# Patient Record
Sex: Female | Born: 1945 | Race: White | Hispanic: No | Marital: Married | Smoking: Never smoker
Health system: Southern US, Community
[De-identification: ages and names within clinical notes are randomized; demographics above are authoritative.]

## PROBLEM LIST (undated history)

## (undated) DIAGNOSIS — Z8619 Personal history of other infectious and parasitic diseases: Secondary | ICD-10-CM

## (undated) DIAGNOSIS — S92912A Unspecified fracture of left toe(s), initial encounter for closed fracture: Secondary | ICD-10-CM

## (undated) DIAGNOSIS — M26629 Arthralgia of temporomandibular joint, unspecified side: Secondary | ICD-10-CM

## (undated) DIAGNOSIS — K5790 Diverticulosis of intestine, part unspecified, without perforation or abscess without bleeding: Secondary | ICD-10-CM

## (undated) DIAGNOSIS — M1990 Unspecified osteoarthritis, unspecified site: Secondary | ICD-10-CM

## (undated) DIAGNOSIS — M539 Dorsopathy, unspecified: Secondary | ICD-10-CM

## (undated) DIAGNOSIS — B0229 Other postherpetic nervous system involvement: Secondary | ICD-10-CM

## (undated) DIAGNOSIS — E785 Hyperlipidemia, unspecified: Secondary | ICD-10-CM

## (undated) DIAGNOSIS — N952 Postmenopausal atrophic vaginitis: Secondary | ICD-10-CM

## (undated) DIAGNOSIS — E559 Vitamin D deficiency, unspecified: Secondary | ICD-10-CM

## (undated) DIAGNOSIS — G8929 Other chronic pain: Secondary | ICD-10-CM

## (undated) DIAGNOSIS — M8580 Other specified disorders of bone density and structure, unspecified site: Secondary | ICD-10-CM

## (undated) DIAGNOSIS — R6884 Jaw pain: Secondary | ICD-10-CM

## (undated) DIAGNOSIS — E871 Hypo-osmolality and hyponatremia: Secondary | ICD-10-CM

## (undated) HISTORY — DX: Jaw pain: R68.84

## (undated) HISTORY — DX: Hypo-osmolality and hyponatremia: E87.1

## (undated) HISTORY — DX: Personal history of other infectious and parasitic diseases: Z86.19

## (undated) HISTORY — PX: JOINT REPLACEMENT: SHX530

## (undated) HISTORY — DX: Other chronic pain: G89.29

## (undated) HISTORY — DX: Dorsopathy, unspecified: M53.9

## (undated) HISTORY — DX: Other specified disorders of bone density and structure, unspecified site: M85.80

## (undated) HISTORY — DX: Arthralgia of temporomandibular joint, unspecified side: M26.629

## (undated) HISTORY — PX: APPENDECTOMY: SHX54

## (undated) HISTORY — PX: ADENOIDECTOMY: SHX5191

## (undated) HISTORY — PX: KNEE ARTHROSCOPY: SUR90

---

## 1969-04-12 HISTORY — PX: WISDOM TOOTH EXTRACTION: SHX21

## 1997-06-24 ENCOUNTER — Ambulatory Visit (HOSPITAL_COMMUNITY): Admission: RE | Admit: 1997-06-24 | Discharge: 1997-06-24 | Payer: Self-pay | Admitting: *Deleted

## 1997-09-16 ENCOUNTER — Other Ambulatory Visit: Admission: RE | Admit: 1997-09-16 | Discharge: 1997-09-16 | Payer: Self-pay | Admitting: Obstetrics and Gynecology

## 1998-07-09 ENCOUNTER — Ambulatory Visit (HOSPITAL_COMMUNITY): Admission: RE | Admit: 1998-07-09 | Discharge: 1998-07-09 | Payer: Self-pay | Admitting: *Deleted

## 1998-10-10 ENCOUNTER — Other Ambulatory Visit: Admission: RE | Admit: 1998-10-10 | Discharge: 1998-10-10 | Payer: Self-pay | Admitting: *Deleted

## 1999-11-03 ENCOUNTER — Other Ambulatory Visit: Admission: RE | Admit: 1999-11-03 | Discharge: 1999-11-03 | Payer: Self-pay | Admitting: Obstetrics and Gynecology

## 2000-11-29 ENCOUNTER — Other Ambulatory Visit: Admission: RE | Admit: 2000-11-29 | Discharge: 2000-11-29 | Payer: Self-pay | Admitting: Obstetrics and Gynecology

## 2002-01-09 ENCOUNTER — Other Ambulatory Visit: Admission: RE | Admit: 2002-01-09 | Discharge: 2002-01-09 | Payer: Self-pay | Admitting: Obstetrics and Gynecology

## 2003-01-15 ENCOUNTER — Other Ambulatory Visit: Admission: RE | Admit: 2003-01-15 | Discharge: 2003-01-15 | Payer: Self-pay | Admitting: Obstetrics and Gynecology

## 2003-11-20 HISTORY — PX: COLONOSCOPY: SHX174

## 2004-12-11 DIAGNOSIS — Z8619 Personal history of other infectious and parasitic diseases: Secondary | ICD-10-CM

## 2004-12-11 HISTORY — DX: Personal history of other infectious and parasitic diseases: Z86.19

## 2006-04-19 ENCOUNTER — Encounter: Admission: RE | Admit: 2006-04-19 | Discharge: 2006-04-19 | Payer: Self-pay | Admitting: Internal Medicine

## 2006-10-18 ENCOUNTER — Encounter: Admission: RE | Admit: 2006-10-18 | Discharge: 2006-10-18 | Payer: Self-pay | Admitting: Internal Medicine

## 2010-05-02 ENCOUNTER — Encounter: Payer: Self-pay | Admitting: Internal Medicine

## 2010-05-03 ENCOUNTER — Encounter: Payer: Self-pay | Admitting: Internal Medicine

## 2011-07-12 DIAGNOSIS — S92912A Unspecified fracture of left toe(s), initial encounter for closed fracture: Secondary | ICD-10-CM

## 2011-07-12 HISTORY — DX: Unspecified fracture of left toe(s), initial encounter for closed fracture: S92.912A

## 2011-07-25 ENCOUNTER — Ambulatory Visit: Payer: MEDICARE

## 2011-07-25 ENCOUNTER — Ambulatory Visit (INDEPENDENT_AMBULATORY_CARE_PROVIDER_SITE_OTHER): Payer: MEDICARE | Admitting: Family Medicine

## 2011-07-25 VITALS — BP 102/68 | HR 70 | Temp 98.7°F | Resp 16 | Ht 65.0 in | Wt 129.0 lb

## 2011-07-25 DIAGNOSIS — M79609 Pain in unspecified limb: Secondary | ICD-10-CM

## 2011-07-25 DIAGNOSIS — M79675 Pain in left toe(s): Secondary | ICD-10-CM

## 2011-07-25 NOTE — Progress Notes (Signed)
  Subjective:    Patient ID: Courtney Shaffer, female    DOB: June 05, 1945, 66 y.o.   MRN: [id number]  HPI Patient presents complaining of (L) 5th toe pain. Injury in January.  Toe deviated laterally.  Patient self reduced toe. Since toe feels "thick and swollen".  Skin burns or throbs on the lateral aspect of digit.   Review of Systems     Objective:   Physical Exam  Constitutional: She appears well-developed.  Cardiovascular: Normal rate, regular rhythm and normal heart sounds.   Pulmonary/Chest: Effort normal.  Musculoskeletal:       Feet:  Neurological: She is alert.    [facility] reading (PRIMARY) by  Dr. 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

## 2011-08-25 ENCOUNTER — Encounter: Payer: Self-pay | Admitting: Internal Medicine

## 2011-08-25 ENCOUNTER — Ambulatory Visit (INDEPENDENT_AMBULATORY_CARE_PROVIDER_SITE_OTHER): Payer: MEDICARE | Admitting: Internal Medicine

## 2011-08-25 VITALS — BP 104/59 | HR 71 | Temp 98.1°F | Resp 16 | Ht 64.5 in | Wt 131.4 lb

## 2011-08-25 DIAGNOSIS — E559 Vitamin D deficiency, unspecified: Secondary | ICD-10-CM | POA: Insufficient documentation

## 2011-08-25 DIAGNOSIS — Z0000 Encounter for general adult medical examination without abnormal findings: Secondary | ICD-10-CM

## 2011-08-25 DIAGNOSIS — E785 Hyperlipidemia, unspecified: Secondary | ICD-10-CM | POA: Insufficient documentation

## 2011-08-25 LAB — LIPID PANEL
LDL Cholesterol: 87 mg/dL (ref 0–99)
Triglycerides: 53 mg/dL (ref <150)

## 2011-08-25 LAB — COMPREHENSIVE METABOLIC PANEL
ALT: 28 U/L (ref 0–35)
Albumin: 4.4 g/dL (ref 3.5–5.2)
CO2: 31 mEq/L (ref 19–32)
Calcium: 9.8 mg/dL (ref 8.4–10.5)
Chloride: 99 mEq/L (ref 96–112)
Glucose, Bld: 92 mg/dL (ref 70–99)
Sodium: 135 mEq/L (ref 135–145)
Total Protein: 6.3 g/dL (ref 6.0–8.3)

## 2011-08-25 LAB — CBC WITH DIFFERENTIAL/PLATELET
Basophils Absolute: 0 10*3/uL (ref 0.0–0.1)
Basophils Relative: 1 % (ref 0–1)
Eosinophils Relative: 4 % (ref 0–5)
HCT: 40.1 % (ref 36.0–46.0)
Lymphocytes Relative: 29 % (ref 12–46)
MCHC: 34.2 g/dL (ref 30.0–36.0)
Monocytes Absolute: 0.3 10*3/uL (ref 0.1–1.0)
Neutro Abs: 2.5 10*3/uL (ref 1.7–7.7)
Platelets: 321 10*3/uL (ref 150–400)
RDW: 13.2 % (ref 11.5–15.5)
WBC: 4.2 10*3/uL (ref 4.0–10.5)

## 2011-08-25 NOTE — Progress Notes (Signed)
  Subjective:    Patient ID: Courtney Shaffer, female    DOB: 20-Nov-1945, 66 y.o.   MRN: [id number]  HPIwelcome to Medicare physical checkup She has no current complaints and is recovering from a recent toe fracture Only medication simvastatin although also on vitamin D supplementation because of past vitamin D deficiency. 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 She has mild left knee osteoarthritis. 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 First child at 25 with postpartum depression one or 2kids//Courtney Shaffer = 2 kids and doing well  Past history is healthy OB/GYN doctor [place]  Family history significant for lots of depression/parents had strokes in their 70s No nicotine or alcohol No home safety issues  Review of Systems-All points covered on review of systems survey and all answers are negative Beck depression scale with no points      Objective:   Physical Examno hearing loss Vital signs within normal limits including the bmi HEENT clear including no thyromegaly Heart regular without murmurs Lungs clear Abdomen benign Extremities with full range of motion, full peripheral pulses, and no joint problems except mild swelling of the left fifth toe from recent fracture./no risk of falling based on musculoskeletal dynamics Skin clear Neurological intact    Assessment & Plan:  Welcome to Medicare physical-she is very healthy for her age  Zostavax ordered Tdap 2008 Lipid profile/try to assess possible dose change with simvastatin Check vitamin D/glucose Colonoscopy up-to-date 2005 Mammogram up-to-date Bone density scheduled for Courtney Shaffer Pap up-to-date with breast exam and fecal occult blood testing Prior EKG does not need repeating Consider Pneumovax Followup one year if stable

## 2011-08-26 LAB — VITAMIN D 25 HYDROXY (VIT D DEFICIENCY, FRACTURES): Vit D, 25-Hydroxy: 58 ng/mL (ref 30–89)

## 2011-08-30 ENCOUNTER — Encounter: Payer: Self-pay | Admitting: Internal Medicine

## 2012-05-04 ENCOUNTER — Other Ambulatory Visit: Payer: Self-pay | Admitting: *Deleted

## 2012-05-04 MED ORDER — SIMVASTATIN 20 MG PO TABS: 20.0000 mg | ORAL_TABLET | Freq: Every evening | ORAL | Status: DC

## 2012-07-06 ENCOUNTER — Encounter: Payer: Self-pay | Admitting: Internal Medicine

## 2012-07-13 ENCOUNTER — Encounter: Payer: Self-pay | Admitting: Internal Medicine

## 2012-12-06 DIAGNOSIS — E785 Hyperlipidemia, unspecified: Secondary | ICD-10-CM | POA: Insufficient documentation

## 2012-12-06 DIAGNOSIS — B0229 Other postherpetic nervous system involvement: Secondary | ICD-10-CM | POA: Insufficient documentation

## 2012-12-06 DIAGNOSIS — N952 Postmenopausal atrophic vaginitis: Secondary | ICD-10-CM | POA: Insufficient documentation

## 2012-12-20 ENCOUNTER — Ambulatory Visit (INDEPENDENT_AMBULATORY_CARE_PROVIDER_SITE_OTHER): Payer: Medicare Other | Admitting: Internal Medicine

## 2012-12-20 ENCOUNTER — Encounter: Payer: Self-pay | Admitting: Internal Medicine

## 2012-12-20 VITALS — BP 110/62 | HR 64 | Temp 98.0°F | Resp 16 | Ht 64.5 in | Wt 130.4 lb

## 2012-12-20 DIAGNOSIS — E785 Hyperlipidemia, unspecified: Secondary | ICD-10-CM

## 2012-12-20 DIAGNOSIS — Z0000 Encounter for general adult medical examination without abnormal findings: Secondary | ICD-10-CM

## 2012-12-20 LAB — COMPREHENSIVE METABOLIC PANEL
ALT: 26 U/L (ref 0–35)
AST: 29 U/L (ref 0–37)
Albumin: 4.3 g/dL (ref 3.5–5.2)
Alkaline Phosphatase: 41 U/L (ref 39–117)
BUN: 7 mg/dL (ref 6–23)
Potassium: 4.1 mEq/L (ref 3.5–5.3)
Sodium: 135 mEq/L (ref 135–145)
Total Protein: 6.6 g/dL (ref 6.0–8.3)

## 2012-12-20 LAB — LIPID PANEL
Cholesterol: 204 mg/dL — ABNORMAL HIGH (ref 0–200)
HDL: 84 mg/dL (ref >39)
Total CHOL/HDL Ratio: 2.4 Ratio

## 2012-12-20 LAB — CBC
MCHC: 34.6 g/dL (ref 30.0–36.0)
Platelets: 330 10*3/uL (ref 150–400)
RDW: 13 % (ref 11.5–15.5)
WBC: 3.3 10*3/uL — ABNORMAL LOW (ref 4.0–10.5)

## 2012-12-20 NOTE — Progress Notes (Signed)
  Subjective:    Patient ID: Courtney Shaffer, female    DOB: 1945/10/13, 67 y.o.   MRN: [id number]  HPI Has had a good year. Granddaughter in [place] doing better. Goes to [place] to see daughter and grandchildren. Family all doing well. Teaching 2 private yoga classes.  Husband recently retired. Will be working at [employer].   Has family history of CAD. Comfortable taking Zocor for peace of mind.   Sees derm for screening.  Still has scalp numbness, feels different. Not annoying.  Review of Systems  Constitutional: Negative.   HENT: Negative.        Still a numbness L Occiput but not interf w/ activ  Eyes: Negative.   Respiratory: Negative.   Cardiovascular: Negative.   Gastrointestinal: Negative.   Endocrine: Negative.   Genitourinary: Negative.   Allergic/Immunologic: Negative.   Neurological: Negative.   Hematological: Negative.   Psychiatric/Behavioral: Negative.    Has a shingles vaccine prescription from Dr. Hal Hope.    Objective:   Physical Exam  Constitutional: She is oriented to person, place, and time. She appears well-developed and well-nourished.  HENT:  Head: Normocephalic.  Right Ear: External ear normal.  Left Ear: External ear normal.  Nose: Nose normal.  Mouth/Throat: Oropharynx is clear and moist.  Eyes: Conjunctivae and EOM are normal. Pupils are equal, round, and reactive to light.  Neck: Normal range of motion. Neck supple. No tracheal deviation present. No thyromegaly present.  Cardiovascular: Normal rate, regular rhythm, normal heart sounds and intact distal pulses.   No murmur heard. Pulmonary/Chest: Effort normal and breath sounds normal.  Abdominal: Soft. Bowel sounds are normal. She exhibits no distension and no mass. There is no tenderness. There is no rebound and no guarding.  Musculoskeletal: Normal range of motion. She exhibits no edema and no tenderness.  Lymphadenopathy:    She has no cervical adenopathy.  Neurological: She is alert and  oriented to person, place, and time. She has normal reflexes. No cranial nerve deficit.  Skin: Skin is warm and dry.  Psychiatric: She has a normal mood and affect. Her behavior is normal. Judgment and thought content normal.          Assessment & Plan:  Annual physical exam - Plan: CBC, Comprehensive metabolic panel, Lipid panel  Other and unspecified hyperlipidemia  Meds ordered this encounter  Medications  . simvastatin (ZOCOR) 20 MG tablet    Sig: Take 1 tablet (20 mg total) by mouth every evening.    Dispense:  90 tablet    Refill:  3

## 2012-12-22 ENCOUNTER — Encounter: Payer: Self-pay | Admitting: Internal Medicine

## 2012-12-22 MED ORDER — SIMVASTATIN 20 MG PO TABS
20.0000 mg | ORAL_TABLET | Freq: Every evening | ORAL | Status: DC
Start: 2012-12-22 — End: 2013-06-19

## 2013-06-18 ENCOUNTER — Other Ambulatory Visit: Payer: Self-pay

## 2013-06-18 DIAGNOSIS — E785 Hyperlipidemia, unspecified: Secondary | ICD-10-CM

## 2013-06-18 NOTE — Telephone Encounter (Signed)
Can you resend a written Rx? She is asking for a written Rx to mail to pharmacy, why was this denied?

## 2013-06-18 NOTE — Telephone Encounter (Signed)
Patient came into 104 to drop off a letter requesting medication (mail in order form for Courtney Shaffer to complete) for simastation. Tab 20mg . I have added her new insurance aarp medicare complete in the system (in case clinical needs it for prior auth or not) please mail back to her when complete so she can put her payment into it also which is required . Thank you!  For questions call: [phone].

## 2013-06-18 NOTE — Telephone Encounter (Signed)
Pended, please advise. Patient wants printed Rx, please let me know after this prints so I can mail to patient with her form

## 2013-06-19 MED ORDER — SIMVASTATIN 20 MG PO TABS: 20.0000 mg | ORAL_TABLET | Freq: Every evening | ORAL | Status: DC

## 2013-06-19 NOTE — Addendum Note (Signed)
Addended by: Elwyn Reach A on: 06/19/2013 05:12 PM   Modules accepted: Orders

## 2013-06-19 NOTE — Telephone Encounter (Signed)
Rx printed and signed. Mailed back to pt w/form in envelope provided. Called and [facility] for pt to let her know it is being mailed out to her.

## 2013-08-22 ENCOUNTER — Ambulatory Visit (INDEPENDENT_AMBULATORY_CARE_PROVIDER_SITE_OTHER): Payer: Medicare Other | Admitting: Internal Medicine

## 2013-08-22 ENCOUNTER — Encounter: Payer: Self-pay | Admitting: Internal Medicine

## 2013-08-22 VITALS — BP 112/65 | HR 68 | Temp 97.6°F | Resp 16 | Ht 64.5 in | Wt 127.0 lb

## 2013-08-22 DIAGNOSIS — E785 Hyperlipidemia, unspecified: Secondary | ICD-10-CM

## 2013-08-22 DIAGNOSIS — M8580 Other specified disorders of bone density and structure, unspecified site: Secondary | ICD-10-CM

## 2013-08-22 DIAGNOSIS — E782 Mixed hyperlipidemia: Secondary | ICD-10-CM | POA: Insufficient documentation

## 2013-08-22 DIAGNOSIS — Z0000 Encounter for general adult medical examination without abnormal findings: Secondary | ICD-10-CM

## 2013-08-22 HISTORY — DX: Other specified disorders of bone density and structure, unspecified site: M85.80

## 2013-08-22 LAB — POCT URINALYSIS DIPSTICK
Bilirubin, UA: NEGATIVE
Glucose, UA: NEGATIVE
Ketones, UA: NEGATIVE
Nitrite, UA: NEGATIVE
PROTEIN UA: NEGATIVE
Spec Grav, UA: 1.015
UROBILINOGEN UA: 0.2
pH, UA: 7.5

## 2013-08-22 LAB — CBC
HCT: 39.5 % (ref 36.0–46.0)
HEMOGLOBIN: 13.3 g/dL (ref 12.0–15.0)
MCH: 32 pg (ref 26.0–34.0)
MCHC: 33.7 g/dL (ref 30.0–36.0)
MCV: 95.2 fL (ref 78.0–100.0)
PLATELETS: 323 10*3/uL (ref 150–400)
RBC: 4.15 MIL/uL (ref 3.87–5.11)
RDW: 13.2 % (ref 11.5–15.5)
WBC: 3.7 10*3/uL — ABNORMAL LOW (ref 4.0–10.5)

## 2013-08-22 MED ORDER — SIMVASTATIN 20 MG PO TABS: 20.0000 mg | ORAL_TABLET | Freq: Every evening | ORAL | Status: DC

## 2013-08-22 NOTE — Progress Notes (Signed)
Subjective:  This chart was scribed for Tami Lin, MD by Mercy Moore, Medial Scribe. This patient was seen in room 27 and the patient's care was started at 10:49 AM.    Patient ID: Courtney Shaffer, female    DOB: [birth year], 68 y.o.   MRN: [id number]  HPI HPI Comments: Courtney Shaffer is a 68 y.o. female who presents to the [facility] requesting a complete physical exam. Patient says she feels great. Patient just came from an ophthalmologist appointment and reports that her eyes are fine with just a fine change in her vision. Patient shares plans to go camping very soon [employer] w/ Col[place] grandkids. Patient states that she is sleeping well. Patient states that she is still hesitant to receive the Shingles vaccine. States she does not receive seasonal vaccinations either. She is informed that vaccines are primarily to ensure that patients are not vectors for young and old. 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 Hyperlipemia 08/25/2011  . Vitamin d deficiency 08/25/2011   Past Medical History  Diagnosis Date  . Osteopenia    Past Surgical History  Procedure Laterality Date  . Appendectomy    . Knee arthroscopy     Allergies  Allergen Reactions  . Codeine    Prior to Admission medications   Medication Sig Start Date End Date Taking? Authorizing Provider  Multiple Vitamin (MULTIVITAMIN) tablet Take 1 tablet by mouth daily.    Historical Provider, MD  simvastatin (ZOCOR) 20 MG tablet Take 1 tablet (20 mg total) by mouth every evening. 06/19/13   Leandrew Koyanagi, MD     Review of Systems   All systems negative. See pink sheet     Objective:   Physical Exam  Nursing note and vitals reviewed. Constitutional: She is oriented to person, place, and time. She appears well-developed and well-nourished.  No distress.  HENT:  Head: Normocephalic and atraumatic.  Right Ear: External ear normal.  Left Ear: External ear normal.  Nose: Nose normal.  Mouth/Throat: Oropharynx is clear and moist.  Eyes: Conjunctivae and EOM are normal. Pupils are equal, round, and reactive to light.  Neck: Normal range of motion. Neck supple. No thyromegaly present.  Cardiovascular: Normal rate, regular rhythm and normal heart sounds.  Exam reveals no gallop and no friction rub.   No murmur heard. Pulmonary/Chest: Effort normal and breath sounds normal. No respiratory distress. She has no wheezes. She has no rales.  Abdominal: Soft. Bowel sounds are normal. She exhibits no distension and no mass. There is no tenderness. There is no rebound.  Musculoskeletal: Normal range of motion. She exhibits no edema and no tenderness.  Lymphadenopathy:    She has no cervical adenopathy.  Neurological: She is alert and oriented to person, place, and time. She has normal reflexes. No cranial nerve deficit.  Skin: Skin is warm and dry.  Psychiatric: She has a normal mood and affect. Her behavior is normal. Judgment and thought content normal.   BP 112/65  Pulse 68  Temp(Src) 97.6 F (36.4 C)  Resp 16  Ht 5' 4.5" (1.638 m)  Wt 127 lb (57.607 kg)  BMI 21.47 kg/m2  SpO2 100%        Assessment & Plan:  I have completed the patient encounter in its entirety as documented by the  scribe, with editing by me where necessary. [place] Shaffer P. Laney Pastor, M.D.  Annual physical exam - Plan: COMPLETE METABOLIC PANEL WITH GFR, CBC, POCT urinalysis dipstick  Other and unspecified hyperlipidemia - Plan: Lipid panel, simvastatin (ZOCOR) 20 MG tablet, DISCONTINUED: simvastatin (ZOCOR) 20 MG tablet  Osteopenia-R Hip  Meds ordered this encounter  Medications  . simvastatin (ZOCOR) 20 MG tablet    Sig: Take 1 tablet (20 mg total) by mouth every evening.    Dispense:  90 tablet    Refill:  3

## 2013-08-23 LAB — COMPLETE METABOLIC PANEL WITH GFR
ALT: 26 U/L (ref 0–35)
AST: 24 U/L (ref 0–37)
Albumin: 4.4 g/dL (ref 3.5–5.2)
Alkaline Phosphatase: 47 U/L (ref 39–117)
BUN: 9 mg/dL (ref 6–23)
CALCIUM: 9.4 mg/dL (ref 8.4–10.5)
CHLORIDE: 98 meq/L (ref 96–112)
CO2: 29 mEq/L (ref 19–32)
CREATININE: 0.62 mg/dL (ref 0.50–1.10)
GFR, Est African American: >89 mL/min
GFR, Est Non African American: >89 mL/min
Glucose, Bld: 96 mg/dL (ref 70–99)
Potassium: 4.2 mEq/L (ref 3.5–5.3)
Sodium: 134 mEq/L — ABNORMAL LOW (ref 135–145)
Total Bilirubin: 0.5 mg/dL (ref 0.2–1.2)
Total Protein: 6.2 g/dL (ref 6.0–8.3)

## 2013-08-23 LAB — LIPID PANEL
CHOL/HDL RATIO: 2.4 ratio
Cholesterol: 207 mg/dL — ABNORMAL HIGH (ref 0–200)
HDL: 85 mg/dL (ref >39)
LDL CALC: 109 mg/dL — AB (ref 0–99)
Triglycerides: 66 mg/dL (ref <150)
VLDL: 13 mg/dL (ref 0–40)

## 2013-08-28 ENCOUNTER — Encounter: Payer: Self-pay | Admitting: Internal Medicine

## 2013-09-19 ENCOUNTER — Encounter: Payer: Medicare Other | Admitting: Internal Medicine

## 2014-01-21 ENCOUNTER — Encounter: Payer: Self-pay | Admitting: Internal Medicine

## 2014-03-18 ENCOUNTER — Ambulatory Visit (AMBULATORY_SURGERY_CENTER): Payer: Self-pay | Admitting: *Deleted

## 2014-03-18 VITALS — Ht 65.0 in | Wt 134.0 lb

## 2014-03-18 DIAGNOSIS — Z1211 Encounter for screening for malignant neoplasm of colon: Secondary | ICD-10-CM

## 2014-03-18 MED ORDER — MOVIPREP 100 G PO SOLR: ORAL | Status: DC

## 2014-03-18 NOTE — Progress Notes (Signed)
Patient denies any allergies to eggs or soy. Patient denies any problems with anesthesia/sedation. Patient states she "woke up" during her knee surgery and felt pressure during colonoscopy. Patient denies any oxygen use at home and does not take any diet/weight loss medications. EMMI education assisgned to patient on colonoscopy, this was explained and instructions given to patient.

## 2014-04-01 ENCOUNTER — Encounter: Payer: Self-pay | Admitting: Internal Medicine

## 2014-04-01 ENCOUNTER — Ambulatory Visit (AMBULATORY_SURGERY_CENTER): Payer: Medicare Other | Admitting: Internal Medicine

## 2014-04-01 VITALS — BP 125/68 | HR 58 | Temp 97.6°F | Resp 36 | Ht 65.0 in | Wt 134.0 lb

## 2014-04-01 DIAGNOSIS — K5790 Diverticulosis of intestine, part unspecified, without perforation or abscess without bleeding: Secondary | ICD-10-CM

## 2014-04-01 DIAGNOSIS — Z1211 Encounter for screening for malignant neoplasm of colon: Secondary | ICD-10-CM

## 2014-04-01 HISTORY — PX: COLONOSCOPY: SHX174

## 2014-04-01 HISTORY — DX: Diverticulosis of intestine, part unspecified, without perforation or abscess without bleeding: K57.90

## 2014-04-01 MED ORDER — SODIUM CHLORIDE 0.9 % IV SOLN: 500.0000 mL | INTRAVENOUS | Status: DC

## 2014-04-01 NOTE — Op Note (Signed)
[facility]  [address] [place], [address]   COLONOSCOPY PROCEDURE REPORT  PATIENT: Courtney Shaffer, Courtney Shaffer  MR#: [id number] BIRTHDATE: 1945-10-26 , 87  yrs. old GENDER: female ENDOSCOPIST: Eustace Quail, MD REFERRED QT:[id number] Laney Pastor, M.D. PROCEDURE DATE:  04/01/2014 PROCEDURE:   Colonoscopy, screening First Screening Colonoscopy - Avg.  risk and is 50 yrs.  old or older - No.  Prior Negative Screening - Now for repeat screening. 10 or more years since last screening  History of Adenoma - Now for follow-up colonoscopy & has been > or = to 3 yrs.  N/A  Polyps Removed Today? No.  Recommend repeat exam, <10 yrs? No. ASA CLASS:   Class II INDICATIONS:average risk for colorectal cancer.. Negative index exam August 2005 [facility] gastroenterology) MEDICATIONS: Monitored anesthesia care and Propofol 230 mg IV  DESCRIPTION OF PROCEDURE:   After the risks benefits and alternatives of the procedure were thoroughly explained, informed consent was obtained.  The digital rectal exam revealed no abnormalities of the rectum.   The LB [id number]  endoscope was introduced through the anus and advanced to the cecum, which was identified by both the appendix and ileocecal valve. No adverse events experienced.   The quality of the prep was excellent, using MoviPrep  The instrument was then slowly withdrawn as the colon was fully examined.      COLON FINDINGS: There was moderate diverticulosis noted in the sigmoid colon and ascending colon.   The examination was otherwise normal.  Retroflexed views revealed no abnormalities. The time to cecum=5 minutes 16 seconds.  Withdrawal time=9 minutes 31 seconds. The scope was withdrawn and the procedure completed.   COMPLICATIONS: There were no immediate complications.  ENDOSCOPIC IMPRESSION: 1.   Moderate diverticulosis was noted in the sigmoid colon and ascending colon 2.   The examination was otherwise  normal  RECOMMENDATIONS: 1. Continue current colorectal screening recommendations for "routine risk" patients with a repeat colonoscopy in 10 years.  eSigned:  Eustace Quail, MD 04/01/2014 8:56 AM   cc: Tami Lin, MD and The Patient

## 2014-04-01 NOTE — Progress Notes (Signed)
Report to PACU, RN, vss, BBS= Clear.  

## 2014-04-01 NOTE — Patient Instructions (Signed)
Discharge instructions given. Handouts on diverticulosis and a high fiber diet. Resume previous medications. YOU HAD AN ENDOSCOPIC PROCEDURE TODAY AT THE [facility]: Refer to the procedure report that was given to you for any specific questions about what was found during the examination.  If the procedure report does not answer your questions, please call your gastroenterologist to clarify.  If you requested that your care partner not be given the details of your procedure findings, then the procedure report has been included in a sealed envelope for you to review at your convenience later.  YOU SHOULD EXPECT: Some feelings of bloating in the abdomen. Passage of more gas than usual.  Walking can help get rid of the air that was put into your GI tract during the procedure and reduce the bloating. If you had a lower endoscopy (such as a colonoscopy or flexible sigmoidoscopy) you may notice spotting of blood in your stool or on the toilet paper. If you underwent a bowel prep for your procedure, then you may not have a normal bowel movement for a few days.  DIET: Your first meal following the procedure should be a light meal and then it is ok to progress to your normal diet.  A half-sandwich or bowl of soup is an example of a good first meal.  Heavy or fried foods are harder to digest and may make you feel nauseous or bloated.  Likewise meals heavy in dairy and vegetables can cause extra gas to form and this can also increase the bloating.  Drink plenty of fluids but you should avoid alcoholic beverages for 24 hours.  ACTIVITY: Your care partner should take you home directly after the procedure.  You should plan to take it easy, moving slowly for the rest of the day.  You can resume normal activity the day after the procedure however you should NOT DRIVE or use heavy machinery for 24 hours (because of the sedation medicines used during the test).    SYMPTOMS TO REPORT IMMEDIATELY: A  gastroenterologist can be reached at any hour.  During normal business hours, 8:30 AM to 5:00 PM Monday through Friday, call [phone].  After hours and on weekends, please call the GI answering service at [phone] who will take a message and have the physician on call contact you.   Following lower endoscopy (colonoscopy or flexible sigmoidoscopy):  Excessive amounts of blood in the stool  Significant tenderness or worsening of abdominal pains  Swelling of the abdomen that is new, acute  Fever of 100F or higher  FOLLOW UP: If any biopsies were taken you will be contacted by phone or by letter within the next 1-3 weeks.  Call your gastroenterologist if you have not heard about the biopsies in 3 weeks.  Our staff will call the home number listed on your records the next business day following your procedure to check on you and address any questions or concerns that you may have at that time regarding the information given to you following your procedure. This is a courtesy call and so if there is no answer at the home number and we have not heard from you through the emergency physician on call, we will assume that you have returned to your regular daily activities without incident.  SIGNATURES/CONFIDENTIALITY: You and/or your care partner have signed paperwork which will be entered into your electronic medical record.  These signatures attest to the fact that that the information above on your After Visit Summary   has been reviewed and is understood.  Full responsibility of the confidentiality of this discharge information lies with you and/or your care-partner. 

## 2014-04-02 ENCOUNTER — Encounter: Payer: Self-pay | Admitting: Internal Medicine

## 2014-04-02 ENCOUNTER — Telehealth: Payer: Self-pay

## 2014-04-02 NOTE — Telephone Encounter (Signed)
Left message on answering machine. 

## 2014-04-08 ENCOUNTER — Telehealth: Payer: Self-pay | Admitting: Radiology

## 2014-04-08 DIAGNOSIS — E785 Hyperlipidemia, unspecified: Secondary | ICD-10-CM

## 2014-04-08 MED ORDER — SIMVASTATIN 20 MG PO TABS: 20.0000 mg | ORAL_TABLET | Freq: Every evening | ORAL | Status: DC

## 2014-04-08 NOTE — Telephone Encounter (Signed)
Patient walked in asking for renewal of the Simvastatin, this is sent in for her.

## 2014-07-01 ENCOUNTER — Encounter: Payer: Self-pay | Admitting: *Deleted

## 2014-07-25 ENCOUNTER — Telehealth: Payer: Self-pay

## 2014-07-25 DIAGNOSIS — E785 Hyperlipidemia, unspecified: Secondary | ICD-10-CM

## 2014-07-25 MED ORDER — SIMVASTATIN 20 MG PO TABS: 20.0000 mg | ORAL_TABLET | Freq: Every evening | ORAL | Status: DC

## 2014-07-25 NOTE — Telephone Encounter (Signed)
Patient request a refill on Simvastatin Tab 20MG . Pharmacy [facility] [phone]

## 2014-07-25 NOTE — Telephone Encounter (Signed)
Appt in June, Rx sent in.

## 2014-08-29 LAB — HM PAP SMEAR: HM Pap smear: NEGATIVE

## 2014-09-18 ENCOUNTER — Encounter: Payer: Self-pay | Admitting: Internal Medicine

## 2014-09-18 ENCOUNTER — Ambulatory Visit (INDEPENDENT_AMBULATORY_CARE_PROVIDER_SITE_OTHER): Payer: Medicare Other | Admitting: Internal Medicine

## 2014-09-18 VITALS — BP 90/52 | HR 75 | Temp 97.8°F | Resp 16 | Ht 64.5 in | Wt 133.4 lb

## 2014-09-18 DIAGNOSIS — E785 Hyperlipidemia, unspecified: Secondary | ICD-10-CM

## 2014-09-18 DIAGNOSIS — Z0000 Encounter for general adult medical examination without abnormal findings: Secondary | ICD-10-CM | POA: Diagnosis not present

## 2014-09-18 MED ORDER — SIMVASTATIN 20 MG PO TABS: 20.0000 mg | ORAL_TABLET | Freq: Every evening | ORAL | Status: DC

## 2014-09-18 NOTE — Progress Notes (Signed)
   Subjective:    Patient ID: Courtney Shaffer, female    DOB: [birth year], 69 y.o.   MRN: [id number]  HPI Annual F/u HL  Doing well Extremely active without complaints Active his grandmother Health maintenance issues up-to-date as far she is concerned--he declines flu shot Zostavax and pneumonia vaccine in keeping with her lifestyle Recent pneumonia negative. Recent colonoscopy negative No side effects from statins   Review of Systems  Constitutional: Negative.   HENT: Positive for nosebleeds and sore throat.        Feels like she developed a cold over the last 36 hours No fever or cough  Eyes: Negative.   Respiratory: Negative.   Cardiovascular: Negative.   Gastrointestinal: Negative.   Endocrine: Negative.   Genitourinary: Negative.   Musculoskeletal: Negative.   Skin: Negative.   Allergic/Immunologic: Negative.   Neurological: Negative.   Hematological: Negative.   Psychiatric/Behavioral: Negative.        Objective:   Physical Exam  Constitutional: She is oriented to person, place, and time. She appears well-developed and well-nourished. No distress.  HENT:  Head: Normocephalic.  Right Ear: External ear normal.  Left Ear: External ear normal.  Nose: Nose normal.  Mouth/Throat: Oropharynx is clear and moist.  Eyes: Conjunctivae and EOM are normal. Pupils are equal, round, and reactive to light.  Neck: Normal range of motion. Neck supple. No thyromegaly present.  Cardiovascular: Normal rate, regular rhythm, normal heart sounds and intact distal pulses.   No murmur heard. Pulmonary/Chest: Effort normal and breath sounds normal. She has no wheezes.  Abdominal: Soft. Bowel sounds are normal. She exhibits no distension and no mass. There is no tenderness. There is no rebound.  Musculoskeletal: Normal range of motion. She exhibits no edema or tenderness.  Lymphadenopathy:    She has no cervical adenopathy.  Neurological: She is alert and oriented to person, place, and  time. She has normal reflexes. No cranial nerve deficit.  Skin: Skin is warm and dry. No rash noted.  Psychiatric: She has a normal mood and affect. Her behavior is normal. Judgment and thought content normal.  Nursing note and vitals reviewed. BP 90/52 mmHg  Pulse 75  Temp(Src) 97.8 F (36.6 C) (Oral)  Resp 16  Ht 5' 4.5" (1.638 m)  Wt 133 lb 6.4 oz (60.51 kg)  BMI 22.55 kg/m2  SpO2 96%         Assessment & Plan:  Hyperlipidemia - Plan: simvastatin (ZOCOR) 20 MG tablet, Lipid panel, Comprehensive metabolic panel, CBC  Encounter for annual physical exam  Meds ordered this encounter  Medications  . simvastatin (ZOCOR) 20 MG tablet    Sig: Take 1 tablet (20 mg total) by mouth every evening.    Dispense:  90 tablet    Refill:  3   Contact labs

## 2014-09-19 ENCOUNTER — Encounter: Payer: Self-pay | Admitting: Internal Medicine

## 2014-09-19 LAB — CBC
HCT: 41 % (ref 36.0–46.0)
Hemoglobin: 13.7 g/dL (ref 12.0–15.0)
MCH: 32.8 pg (ref 26.0–34.0)
MCHC: 33.4 g/dL (ref 30.0–36.0)
MCV: 98.1 fL (ref 78.0–100.0)
MPV: 9.1 fL (ref 8.6–12.4)
Platelets: 288 10*3/uL (ref 150–400)
RBC: 4.18 MIL/uL (ref 3.87–5.11)
RDW: 12.9 % (ref 11.5–15.5)
WBC: 3.3 10*3/uL — ABNORMAL LOW (ref 4.0–10.5)

## 2014-09-19 LAB — COMPREHENSIVE METABOLIC PANEL
ALBUMIN: 4.4 g/dL (ref 3.5–5.2)
ALK PHOS: 48 U/L (ref 39–117)
ALT: 30 U/L (ref 0–35)
AST: 28 U/L (ref 0–37)
BILIRUBIN TOTAL: 0.3 mg/dL (ref 0.2–1.2)
BUN: 9 mg/dL (ref 6–23)
CALCIUM: 9.4 mg/dL (ref 8.4–10.5)
CO2: 24 mEq/L (ref 19–32)
Chloride: 100 mEq/L (ref 96–112)
Creat: 0.73 mg/dL (ref 0.50–1.10)
Glucose, Bld: 94 mg/dL (ref 70–99)
Potassium: 4 mEq/L (ref 3.5–5.3)
SODIUM: 136 meq/L (ref 135–145)
Total Protein: 6.4 g/dL (ref 6.0–8.3)

## 2014-09-19 LAB — LIPID PANEL
CHOLESTEROL: 199 mg/dL (ref 0–200)
HDL: 87 mg/dL (ref >=46)
LDL Cholesterol: 99 mg/dL (ref 0–99)
TRIGLYCERIDES: 65 mg/dL (ref <150)
Total CHOL/HDL Ratio: 2.3 Ratio
VLDL: 13 mg/dL (ref 0–40)

## 2015-03-10 ENCOUNTER — Encounter: Payer: Self-pay | Admitting: Internal Medicine

## 2015-03-18 LAB — HM DEXA SCAN

## 2015-03-18 LAB — HM MAMMOGRAPHY

## 2015-04-24 DIAGNOSIS — B070 Plantar wart: Secondary | ICD-10-CM | POA: Diagnosis not present

## 2015-04-29 ENCOUNTER — Encounter: Payer: Self-pay | Admitting: Family Medicine

## 2015-04-29 ENCOUNTER — Ambulatory Visit (INDEPENDENT_AMBULATORY_CARE_PROVIDER_SITE_OTHER): Payer: PPO | Admitting: Family Medicine

## 2015-04-29 VITALS — BP 118/70 | HR 65 | Temp 97.8°F | Ht 64.5 in | Wt 134.2 lb

## 2015-04-29 DIAGNOSIS — M539 Dorsopathy, unspecified: Secondary | ICD-10-CM | POA: Diagnosis not present

## 2015-04-29 DIAGNOSIS — L989 Disorder of the skin and subcutaneous tissue, unspecified: Secondary | ICD-10-CM

## 2015-04-29 DIAGNOSIS — M8580 Other specified disorders of bone density and structure, unspecified site: Secondary | ICD-10-CM

## 2015-04-29 DIAGNOSIS — Z114 Encounter for screening for human immunodeficiency virus [HIV]: Secondary | ICD-10-CM | POA: Diagnosis not present

## 2015-04-29 DIAGNOSIS — Z0000 Encounter for general adult medical examination without abnormal findings: Secondary | ICD-10-CM | POA: Diagnosis not present

## 2015-04-29 DIAGNOSIS — E782 Mixed hyperlipidemia: Secondary | ICD-10-CM

## 2015-04-29 DIAGNOSIS — Z8619 Personal history of other infectious and parasitic diseases: Secondary | ICD-10-CM | POA: Insufficient documentation

## 2015-04-29 DIAGNOSIS — B0229 Other postherpetic nervous system involvement: Secondary | ICD-10-CM

## 2015-04-29 DIAGNOSIS — E559 Vitamin D deficiency, unspecified: Secondary | ICD-10-CM | POA: Diagnosis not present

## 2015-04-29 DIAGNOSIS — Z1159 Encounter for screening for other viral diseases: Secondary | ICD-10-CM | POA: Diagnosis not present

## 2015-04-29 HISTORY — DX: Dorsopathy, unspecified: M53.9

## 2015-04-29 LAB — VITAMIN D 25 HYDROXY (VIT D DEFICIENCY, FRACTURES): VITD: 41.38 ng/mL (ref 30.00–100.00)

## 2015-04-29 LAB — HEPATITIS C ANTIBODY: HCV Ab: NEGATIVE

## 2015-04-29 LAB — CBC
HCT: 39.1 % (ref 36.0–46.0)
Hemoglobin: 13.1 g/dL (ref 12.0–15.0)
MCHC: 33.6 g/dL (ref 30.0–36.0)
MCV: 99 fl (ref 78.0–100.0)
PLATELETS: 311 10*3/uL (ref 150.0–400.0)
RBC: 3.95 Mil/uL (ref 3.87–5.11)
RDW: 12.5 % (ref 11.5–15.5)
WBC: 3.8 10*3/uL — AB (ref 4.0–10.5)

## 2015-04-29 NOTE — Patient Instructions (Addendum)
Folic Acid 1 mg daily NOW company has a 10 strain probiotic daily, at [email or web address]  Preventive Care for Adults, Female A healthy lifestyle and preventive care can promote health and wellness. Preventive health guidelines for women include the following key practices.  A routine yearly physical is a good way to check with your health care provider about your health and preventive screening. It is a chance to share any concerns and updates on your health and to receive a thorough exam.  Visit your dentist for a routine exam and preventive care every 6 months. Brush your teeth twice a day and floss once a day. Good oral hygiene prevents tooth decay and gum disease.  The frequency of eye exams is based on your age, health, family medical history, use of contact lenses, and other factors. Follow your health care provider's recommendations for frequency of eye exams.  Eat a healthy diet. Foods like vegetables, fruits, whole grains, low-fat dairy products, and lean protein foods contain the nutrients you need without too many calories. Decrease your intake of foods high in solid fats, added sugars, and salt. Eat the right amount of calories for you.Get information about a proper diet from your health care provider, if necessary.  Regular physical exercise is one of the most important things you can do for your health. Most adults should get at least 150 minutes of moderate-intensity exercise (any activity that increases your heart rate and causes you to sweat) each week. In addition, most adults need muscle-strengthening exercises on 2 or more days a week.  Maintain a healthy weight. The body mass index (BMI) is a screening tool to identify possible weight problems. It provides an estimate of body fat based on height and weight. Your health care provider can find your BMI and can help you achieve or maintain a healthy weight.For adults 20 years and older:  A BMI below 18.5 is considered  underweight.  A BMI of 18.5 to 24.9 is normal.  A BMI of 25 to 29.9 is considered overweight.  A BMI of 30 and above is considered obese.  Maintain normal blood lipids and cholesterol levels by exercising and minimizing your intake of saturated fat. Eat a balanced diet with plenty of fruit and vegetables. Blood tests for lipids and cholesterol should begin at age 70 and be repeated every 5 years. If your lipid or cholesterol levels are high, you are over 50, or you are at high risk for heart disease, you may need your cholesterol levels checked more frequently.Ongoing high lipid and cholesterol levels should be treated with medicines if diet and exercise are not working.  If you smoke, find out from your health care provider how to quit. If you do not use tobacco, do not start.  Lung cancer screening is recommended for adults aged 55-80 years who are at high risk for developing lung cancer because of a history of smoking. A yearly low-dose CT scan of the lungs is recommended for people who have at least a 30-pack-year history of smoking and are a current smoker or have quit within the past 15 years. A pack year of smoking is smoking an average of 1 pack of cigarettes a day for 1 year (for example: 1 pack a day for 30 years or 2 packs a day for 15 years). Yearly screening should continue until the smoker has stopped smoking for at least 15 years. Yearly screening should be stopped for people who develop a health problem that would prevent them from  having lung cancer treatment.  If you are pregnant, do not drink alcohol. If you are breastfeeding, be very cautious about drinking alcohol. If you are not pregnant and choose to drink alcohol, do not have more than 1 drink per day. One drink is considered to be 12 ounces (355 mL) of beer, 5 ounces (148 mL) of wine, or 1.5 ounces (44 mL) of liquor.  Avoid use of street drugs. Do not share needles with anyone. Ask for help if you need support or  instructions about stopping the use of drugs.  High blood pressure causes heart disease and increases the risk of stroke. Your blood pressure should be checked at least every 1 to 2 years. Ongoing high blood pressure should be treated with medicines if weight loss and exercise do not work.  If you are 34-52 years old, ask your health care provider if you should take aspirin to prevent strokes.  Diabetes screening is done by taking a blood sample to check your blood glucose level after you have not eaten for a certain period of time (fasting). If you are not overweight and you do not have risk factors for diabetes, you should be screened once every 3 years starting at age 44. If you are overweight or obese and you are 72-66 years of age, you should be screened for diabetes every year as part of your cardiovascular risk assessment.  Breast cancer screening is essential preventive care for women. You should practice "breast self-awareness." This means understanding the normal appearance and feel of your breasts and may include breast self-examination. Any changes detected, no matter how small, should be reported to a health care provider. Women in their 28s and 30s should have a clinical breast exam (CBE) by a health care provider as part of a regular health exam every 1 to 3 years. After age 23, women should have a CBE every year. Starting at age 105, women should consider having a mammogram (breast X-ray test) every year. Women who have a family history of breast cancer should talk to their health care provider about genetic screening. Women at a high risk of breast cancer should talk to their health care providers about having an MRI and a mammogram every year.  Breast cancer gene (BRCA)-related cancer risk assessment is recommended for women who have family members with BRCA-related cancers. BRCA-related cancers include breast, ovarian, tubal, and peritoneal cancers. Having family members with these  cancers may be associated with an increased risk for harmful changes (mutations) in the breast cancer genes BRCA1 and BRCA2. Results of the assessment will determine the need for genetic counseling and BRCA1 and BRCA2 testing.  Your health care provider may recommend that you be screened regularly for cancer of the pelvic organs (ovaries, uterus, and vagina). This screening involves a pelvic examination, including checking for microscopic changes to the surface of your cervix (Pap test). You may be encouraged to have this screening done every 3 years, beginning at age 60.  For women ages 79-65, health care providers may recommend pelvic exams and Pap testing every 3 years, or they may recommend the Pap and pelvic exam, combined with testing for human papilloma virus (HPV), every 5 years. Some types of HPV increase your risk of cervical cancer. Testing for HPV may also be done on women of any age with unclear Pap test results.  Other health care providers may not recommend any screening for nonpregnant women who are considered low risk for pelvic cancer and who do  not have symptoms. Ask your health care provider if a screening pelvic exam is right for you.  If you have had past treatment for cervical cancer or a condition that could lead to cancer, you need Pap tests and screening for cancer for at least 20 years after your treatment. If Pap tests have been discontinued, your risk factors (such as having a new sexual partner) need to be reassessed to determine if screening should resume. Some women have medical problems that increase the chance of getting cervical cancer. In these cases, your health care provider may recommend more frequent screening and Pap tests.  Colorectal cancer can be detected and often prevented. Most routine colorectal cancer screening begins at the age of 38 years and continues through age 71 years. However, your health care provider may recommend screening at an earlier age if you  have risk factors for colon cancer. On a yearly basis, your health care provider may provide home test kits to check for hidden blood in the stool. Use of a small camera at the end of a tube, to directly examine the colon (sigmoidoscopy or colonoscopy), can detect the earliest forms of colorectal cancer. Talk to your health care provider about this at age 39, when routine screening begins. Direct exam of the colon should be repeated every 5-10 years through age 61 years, unless early forms of precancerous polyps or small growths are found.  People who are at an increased risk for hepatitis B should be screened for this virus. You are considered at high risk for hepatitis B if:  You were born in a country where hepatitis B occurs often. Talk with your health care provider about which countries are considered high risk.  Your parents were born in a high-risk country and you have not received a shot to protect against hepatitis B (hepatitis B vaccine).  You have HIV or AIDS.  You use needles to inject street drugs.  You live with, or have sex with, someone who has hepatitis B.  You get hemodialysis treatment.  You take certain medicines for conditions like cancer, organ transplantation, and autoimmune conditions.  Hepatitis C blood testing is recommended for all people born from [birth year] through 1964 and any individual with known risks for hepatitis C.  Practice safe sex. Use condoms and avoid high-risk sexual practices to reduce the spread of sexually transmitted infections (STIs). STIs include gonorrhea, chlamydia, syphilis, trichomonas, herpes, HPV, and human immunodeficiency virus (HIV). Herpes, HIV, and HPV are viral illnesses that have no cure. They can result in disability, cancer, and death.  You should be screened for sexually transmitted illnesses (STIs) including gonorrhea and chlamydia if:  You are sexually active and are younger than 24 years.  You are older than 24 years and your  health care provider tells you that you are at risk for this type of infection.  Your sexual activity has changed since you were last screened and you are at an increased risk for chlamydia or gonorrhea. Ask your health care provider if you are at risk.  If you are at risk of being infected with HIV, it is recommended that you take a prescription medicine daily to prevent HIV infection. This is called preexposure prophylaxis (PrEP). You are considered at risk if:  You are sexually active and do not regularly use condoms or know the HIV status of your partner(s).  You take drugs by injection.  You are sexually active with a partner who has HIV.  Talk with your  health care provider about whether you are at high risk of being infected with HIV. If you choose to begin PrEP, you should first be tested for HIV. You should then be tested every 3 months for as long as you are taking PrEP.  Osteoporosis is a disease in which the bones lose minerals and strength with aging. This can result in serious bone fractures or breaks. The risk of osteoporosis can be identified using a bone density scan. Women ages 31 years and over and women at risk for fractures or osteoporosis should discuss screening with their health care providers. Ask your health care provider whether you should take a calcium supplement or vitamin D to reduce the rate of osteoporosis.  Menopause can be associated with physical symptoms and risks. Hormone replacement therapy is available to decrease symptoms and risks. You should talk to your health care provider about whether hormone replacement therapy is right for you.  Use sunscreen. Apply sunscreen liberally and repeatedly throughout the day. You should seek shade when your shadow is shorter than you. Protect yourself by wearing long sleeves, pants, a wide-brimmed hat, and sunglasses year round, whenever you are outdoors.  Once a month, do a whole body skin exam, using a mirror to look  at the skin on your back. Tell your health care provider of new moles, moles that have irregular borders, moles that are larger than a pencil eraser, or moles that have changed in shape or color.  Stay current with required vaccines (immunizations).  Influenza vaccine. All adults should be immunized every year.  Tetanus, diphtheria, and acellular pertussis (Td, Tdap) vaccine. Pregnant women should receive 1 dose of Tdap vaccine during each pregnancy. The dose should be obtained regardless of the length of time since the last dose. Immunization is preferred during the 27th-36th week of gestation. An adult who has not previously received Tdap or who does not know her vaccine status should receive 1 dose of Tdap. This initial dose should be followed by tetanus and diphtheria toxoids (Td) booster doses every 10 years. Adults with an unknown or incomplete history of completing a 3-dose immunization series with Td-containing vaccines should begin or complete a primary immunization series including a Tdap dose. Adults should receive a Td booster every 10 years.  Varicella vaccine. An adult without evidence of immunity to varicella should receive 2 doses or a second dose if she has previously received 1 dose. Pregnant females who do not have evidence of immunity should receive the first dose after pregnancy. This first dose should be obtained before leaving the health care facility. The second dose should be obtained 4-8 weeks after the first dose.  Human papillomavirus (HPV) vaccine. Females aged 13-26 years who have not received the vaccine previously should obtain the 3-dose series. The vaccine is not recommended for use in pregnant females. However, pregnancy testing is not needed before receiving a dose. If a female is found to be pregnant after receiving a dose, no treatment is needed. In that case, the remaining doses should be delayed until after the pregnancy. Immunization is recommended for any person  with an immunocompromised condition through the age of 77 years if she did not get any or all doses earlier. During the 3-dose series, the second dose should be obtained 4-8 weeks after the first dose. The third dose should be obtained 24 weeks after the first dose and 16 weeks after the second dose.  Zoster vaccine. One dose is recommended for adults aged 47 years  or older unless certain conditions are present.  Measles, mumps, and rubella (MMR) vaccine. Adults born before [birth year] generally are considered immune to measles and mumps. Adults born in [birth year] or later should have 1 or more doses of MMR vaccine unless there is a contraindication to the vaccine or there is laboratory evidence of immunity to each of the three diseases. A routine second dose of MMR vaccine should be obtained at least 28 days after the first dose for students attending postsecondary schools, health care workers, or international travelers. People who received inactivated measles vaccine or an unknown type of measles vaccine during 1963-1967 should receive 2 doses of MMR vaccine. People who received inactivated mumps vaccine or an unknown type of mumps vaccine before 1979 and are at high risk for mumps infection should consider immunization with 2 doses of MMR vaccine. For females of childbearing age, rubella immunity should be determined. If there is no evidence of immunity, females who are not pregnant should be vaccinated. If there is no evidence of immunity, females who are pregnant should delay immunization until after pregnancy. Unvaccinated health care workers born before [birth year] who lack laboratory evidence of measles, mumps, or rubella immunity or laboratory confirmation of disease should consider measles and mumps immunization with 2 doses of MMR vaccine or rubella immunization with 1 dose of MMR vaccine.  Pneumococcal 13-valent conjugate (PCV13) vaccine. When indicated, a person who is uncertain of his immunization history and has  no record of immunization should receive the PCV13 vaccine. All adults 65 years of age and older should receive this vaccine. An adult aged 64 years or older who has certain medical conditions and has not been previously immunized should receive 1 dose of PCV13 vaccine. This PCV13 should be followed with a dose of pneumococcal polysaccharide (PPSV23) vaccine. Adults who are at high risk for pneumococcal disease should obtain the PPSV23 vaccine at least 8 weeks after the dose of PCV13 vaccine. Adults older than 69 years of age who have normal immune system function should obtain the PPSV23 vaccine dose at least 1 year after the dose of PCV13 vaccine.  Pneumococcal polysaccharide (PPSV23) vaccine. When PCV13 is also indicated, PCV13 should be obtained first. All adults aged 54 years and older should be immunized. An adult younger than age 40 years who has certain medical conditions should be immunized. Any person who resides in a nursing home or long-term care facility should be immunized. An adult smoker should be immunized. People with an immunocompromised condition and certain other conditions should receive both PCV13 and PPSV23 vaccines. People with human immunodeficiency virus (HIV) infection should be immunized as soon as possible after diagnosis. Immunization during chemotherapy or radiation therapy should be avoided. Routine use of PPSV23 vaccine is not recommended for American Indians, [place] Natives, or people younger than 65 years unless there are medical conditions that require PPSV23 vaccine. When indicated, people who have unknown immunization and have no record of immunization should receive PPSV23 vaccine. One-time revaccination 5 years after the first dose of PPSV23 is recommended for people aged 19-64 years who have chronic kidney failure, nephrotic syndrome, asplenia, or immunocompromised conditions. People who received 1-2 doses of PPSV23 before age 81 years should receive another dose of PPSV23  vaccine at age 76 years or later if at least 5 years have passed since the previous dose. Doses of PPSV23 are not needed for people immunized with PPSV23 at or after age 16 years.  Meningococcal vaccine. Adults with asplenia or persistent complement component  deficiencies should receive 2 doses of quadrivalent meningococcal conjugate (MenACWY-D) vaccine. The doses should be obtained at least 2 months apart. Microbiologists working with certain meningococcal bacteria, [employer] recruits, people at risk during an outbreak, and people who travel to or live in countries with a high rate of meningitis should be immunized. A first-year college student up through age 9 years who is living in a residence hall should receive a dose if she did not receive a dose on or after her 16th birthday. Adults who have certain high-risk conditions should receive one or more doses of vaccine.  Hepatitis A vaccine. Adults who wish to be protected from this disease, have certain high-risk conditions, work with hepatitis A-infected animals, work in hepatitis A research labs, or travel to or work in countries with a high rate of hepatitis A should be immunized. Adults who were previously unvaccinated and who anticipate close contact with an international adoptee during the first 60 days after arrival in the [place] from a country with a high rate of hepatitis A should be immunized.  Hepatitis B vaccine. Adults who wish to be protected from this disease, have certain high-risk conditions, may be exposed to blood or other infectious body fluids, are household contacts or sex partners of hepatitis B positive people, are clients or workers in certain care facilities, or travel to or work in countries with a high rate of hepatitis B should be immunized.  Haemophilus influenzae type b (Hib) vaccine. A previously unvaccinated person with asplenia or sickle cell disease or having a scheduled splenectomy should receive 1 dose of Hib  vaccine. Regardless of previous immunization, a recipient of a hematopoietic stem cell transplant should receive a 3-dose series 6-12 months after her successful transplant. Hib vaccine is not recommended for adults with HIV infection. Preventive Services / Frequency Ages 35 to 20 years  Blood pressure check.** / Every 3-5 years.  Lipid and cholesterol check.** / Every 5 years beginning at age 37.  Clinical breast exam.** / Every 3 years for women in their 62s and 7s.  BRCA-related cancer risk assessment.** / For women who have family members with a BRCA-related cancer (breast, ovarian, tubal, or peritoneal cancers).  Pap test.** / Every 2 years from ages 14 through 19. Every 3 years starting at age 34 through age 68 or 54 with a history of 3 consecutive normal Pap tests.  HPV screening.** / Every 3 years from ages 20 through ages 34 to 6 with a history of 3 consecutive normal Pap tests.  Hepatitis C blood test.** / For any individual with known risks for hepatitis C.  Skin self-exam. / Monthly.  Influenza vaccine. / Every year.  Tetanus, diphtheria, and acellular pertussis (Tdap, Td) vaccine.** / Consult your health care provider. Pregnant women should receive 1 dose of Tdap vaccine during each pregnancy. 1 dose of Td every 10 years.  Varicella vaccine.** / Consult your health care provider. Pregnant females who do not have evidence of immunity should receive the first dose after pregnancy.  HPV vaccine. / 3 doses over 6 months, if 63 and younger. The vaccine is not recommended for use in pregnant females. However, pregnancy testing is not needed before receiving a dose.  Measles, mumps, rubella (MMR) vaccine.** / You need at least 1 dose of MMR if you were born in 1957 or later. You may also need a 2nd dose. For females of childbearing age, rubella immunity should be determined. If there is no evidence of immunity, females who  are not pregnant should be vaccinated. If there is no  evidence of immunity, females who are pregnant should delay immunization until after pregnancy.  Pneumococcal 13-valent conjugate (PCV13) vaccine.** / Consult your health care provider.  Pneumococcal polysaccharide (PPSV23) vaccine.** / 1 to 2 doses if you smoke cigarettes or if you have certain conditions.  Meningococcal vaccine.** / 1 dose if you are age 67 to 28 years and a Market researcher living in a residence hall, or have one of several medical conditions, you need to get vaccinated against meningococcal disease. You may also need additional booster doses.  Hepatitis A vaccine.** / Consult your health care provider.  Hepatitis B vaccine.** / Consult your health care provider.  Haemophilus influenzae type b (Hib) vaccine.** / Consult your health care provider. Ages 46 to 42 years  Blood pressure check.** / Every year.  Lipid and cholesterol check.** / Every 5 years beginning at age 85 years.  Lung cancer screening. / Every year if you are aged 91-80 years and have a 30-pack-year history of smoking and currently smoke or have quit within the past 15 years. Yearly screening is stopped once you have quit smoking for at least 15 years or develop a health problem that would prevent you from having lung cancer treatment.  Clinical breast exam.** / Every year after age 13 years.  BRCA-related cancer risk assessment.** / For women who have family members with a BRCA-related cancer (breast, ovarian, tubal, or peritoneal cancers).  Mammogram.** / Every year beginning at age 18 years and continuing for as long as you are in good health. Consult with your health care provider.  Pap test.** / Every 3 years starting at age 52 years through age 16 or 41 years with a history of 3 consecutive normal Pap tests.  HPV screening.** / Every 3 years from ages 68 years through ages 49 to 62 years with a history of 3 consecutive normal Pap tests.  Fecal occult blood test (FOBT) of stool. /  Every year beginning at age 77 years and continuing until age 18 years. You may not need to do this test if you get a colonoscopy every 10 years.  Flexible sigmoidoscopy or colonoscopy.** / Every 5 years for a flexible sigmoidoscopy or every 10 years for a colonoscopy beginning at age 69 years and continuing until age 31 years.  Hepatitis C blood test.** / For all people born from [birth year] through 1964 and any individual with known risks for hepatitis C.  Skin self-exam. / Monthly.  Influenza vaccine. / Every year.  Tetanus, diphtheria, and acellular pertussis (Tdap/Td) vaccine.** / Consult your health care provider. Pregnant women should receive 1 dose of Tdap vaccine during each pregnancy. 1 dose of Td every 10 years.  Varicella vaccine.** / Consult your health care provider. Pregnant females who do not have evidence of immunity should receive the first dose after pregnancy.  Zoster vaccine.** / 1 dose for adults aged 95 years or older.  Measles, mumps, rubella (MMR) vaccine.** / You need at least 1 dose of MMR if you were born in 1957 or later. You may also need a second dose. For females of childbearing age, rubella immunity should be determined. If there is no evidence of immunity, females who are not pregnant should be vaccinated. If there is no evidence of immunity, females who are pregnant should delay immunization until after pregnancy.  Pneumococcal 13-valent conjugate (PCV13) vaccine.** / Consult your health care provider.  Pneumococcal polysaccharide (PPSV23) vaccine.** / 1 to  2 doses if you smoke cigarettes or if you have certain conditions.  Meningococcal vaccine.** / Consult your health care provider.  Hepatitis A vaccine.** / Consult your health care provider.  Hepatitis B vaccine.** / Consult your health care provider.  Haemophilus influenzae type b (Hib) vaccine.** / Consult your health care provider. Ages 18 years and over  Blood pressure check.** / Every year.  Lipid  and cholesterol check.** / Every 5 years beginning at age 24 years.  Lung cancer screening. / Every year if you are aged 65-80 years and have a 30-pack-year history of smoking and currently smoke or have quit within the past 15 years. Yearly screening is stopped once you have quit smoking for at least 15 years or develop a health problem that would prevent you from having lung cancer treatment.  Clinical breast exam.** / Every year after age 62 years.  BRCA-related cancer risk assessment.** / For women who have family members with a BRCA-related cancer (breast, ovarian, tubal, or peritoneal cancers).  Mammogram.** / Every year beginning at age 27 years and continuing for as long as you are in good health. Consult with your health care provider.  Pap test.** / Every 3 years starting at age 1 years through age 80 or 45 years with 3 consecutive normal Pap tests. Testing can be stopped between 65 and 70 years with 3 consecutive normal Pap tests and no abnormal Pap or HPV tests in the past 10 years.  HPV screening.** / Every 3 years from ages 32 years through ages 81 or 18 years with a history of 3 consecutive normal Pap tests. Testing can be stopped between 65 and 70 years with 3 consecutive normal Pap tests and no abnormal Pap or HPV tests in the past 10 years.  Fecal occult blood test (FOBT) of stool. / Every year beginning at age 33 years and continuing until age 57 years. You may not need to do this test if you get a colonoscopy every 10 years.  Flexible sigmoidoscopy or colonoscopy.** / Every 5 years for a flexible sigmoidoscopy or every 10 years for a colonoscopy beginning at age 85 years and continuing until age 28 years.  Hepatitis C blood test.** / For all people born from [birth year] through 1964 and any individual with known risks for hepatitis C.  Osteoporosis screening.** / A one-time screening for women ages 83 years and over and women at risk for fractures or osteoporosis.  Skin  self-exam. / Monthly.  Influenza vaccine. / Every year.  Tetanus, diphtheria, and acellular pertussis (Tdap/Td) vaccine.** / 1 dose of Td every 10 years.  Varicella vaccine.** / Consult your health care provider.  Zoster vaccine.** / 1 dose for adults aged 29 years or older.  Pneumococcal 13-valent conjugate (PCV13) vaccine.** / Consult your health care provider.  Pneumococcal polysaccharide (PPSV23) vaccine.** / 1 dose for all adults aged 36 years and older.  Meningococcal vaccine.** / Consult your health care provider.  Hepatitis A vaccine.** / Consult your health care provider.  Hepatitis B vaccine.** / Consult your health care provider.  Haemophilus influenzae type b (Hib) vaccine.** / Consult your health care provider. ** Family history and personal history of risk and conditions may change your health care provider's recommendations.   This information is not intended to replace advice given to you by your health care provider. Make sure you discuss any questions you have with your health care provider.   Document Released: 05/25/2001 Document Revised: 04/19/2014 Document Reviewed: 08/24/2010 Elsevier Interactive Patient  Education 2016 [employer].

## 2015-04-29 NOTE — Progress Notes (Signed)
Pre visit review using our clinic review tool, if applicable. No additional management support is needed unless otherwise documented below in the visit note. 

## 2015-04-29 NOTE — Progress Notes (Signed)
Subjective:    Patient ID: Courtney Shaffer, female    DOB: [birth year], 70 y.o.   MRN: [id number]  Chief Complaint  Patient presents with  . Establish Care    HPI Patient is in today for new patient appointment. She has a past medical history significant for osteopenia, vitamin D deficiency, hyperlipidemia, postherpetic neuralgia. She feels well today. No recent illness. Is complaining of some trouble with a painful lesion on the base of her right foot which is been around for years. It has not responded to over-the-counter topical treatments. Denies CP/palp/SOB/HA/congestion/fevers/GI or GU c/o. Taking meds as prescribed  Past Medical History  Diagnosis Date  . Osteopenia   . Osteopenia 08/22/2013  . Multilevel degenerative disc disease 04/29/2015    knees, back  . History of chicken pox     Past Surgical History  Procedure Laterality Date  . Appendectomy    . Knee arthroscopy Bilateral     Wainer left, Collins right  . Colonoscopy  11/20/2003    w/Dr.William Austin=diverticulosis&hemorrhoids  . Adenoidectomy      Family History  Problem Relation Age of Onset  . Osteoporosis Mother   . Mental illness Mother     depression  . Hypertension Brother   . Hyperlipidemia Brother   . Diabetes Father   . Hyperlipidemia Father   . Hypertension Father   . Colon cancer Neg Hx   . Mental illness Daughter     anxiety depression  . Mental illness Maternal Grandmother   . Osteoporosis Maternal Grandmother   . Mental illness Maternal Grandfather   . Mental illness Maternal Aunt   . Mental illness Maternal Uncle     Social History   Social History  . Marital Status: Married    Spouse Name: N/A  . Number of Children: N/A  . Years of Education: N/A   Occupational History  . yoga teacher    Social History Main Topics  . Smoking status: Never Smoker   . Smokeless tobacco: Never Used  . Alcohol Use: No  . Drug Use: No  . Sexual Activity: Yes     Comment: lives with  husband, yoga instructor, no dietary restrictions   Other Topics Concern  . Not on file   Social History Narrative   Married. Education: [employer]. Exercise: Yes    Outpatient Prescriptions Prior to Visit  Medication Sig Dispense Refill  . Ascorbic Acid (VITAMIN C PO) Take 1 tablet by mouth daily.    . B Complex Vitamins (VITAMIN-B COMPLEX) TABS Take 1 tablet by mouth daily.    . Cholecalciferol (VITAMIN D PO) Take 1 tablet by mouth daily. 2000 IU daily    . estradiol (ESTRACE) 0.1 MG/GM vaginal cream Place 2 g vaginally.    . simvastatin (ZOCOR) 20 MG tablet Take 1 tablet (20 mg total) by mouth every evening. 90 tablet 3  . VITAMIN E PO Take 1 capsule by mouth daily.    . Multiple Vitamin (MULTIVITAMIN) tablet Take 1 tablet by mouth daily. Reported on 04/29/2015     No facility-administered medications prior to visit.    Allergies  Allergen Reactions  . Codeine     Review of Systems  Constitutional: Negative for fever, chills and malaise/fatigue.  HENT: Negative for congestion and hearing loss.   Eyes: Negative for discharge.  Respiratory: Negative for cough, sputum production and shortness of breath.   Cardiovascular: Negative for chest pain, palpitations and leg swelling.  Gastrointestinal: Negative for heartburn, nausea, vomiting, abdominal pain,  diarrhea, constipation and blood in stool.  Genitourinary: Negative for dysuria, urgency, frequency and hematuria.  Musculoskeletal: Negative for myalgias, back pain and falls.  Skin: Negative for rash.  Neurological: Negative for dizziness, sensory change, loss of consciousness, weakness and headaches.  Endo/Heme/Allergies: Negative for environmental allergies. Does not bruise/bleed easily.  Psychiatric/Behavioral: Negative for depression and suicidal ideas. The patient is not nervous/anxious and does not have insomnia.        Objective:    Physical Exam  Constitutional: She is oriented to person, place, and time. She appears  well-developed and well-nourished. No distress.  HENT:  Head: Normocephalic and atraumatic.  Eyes: Conjunctivae are normal.  Neck: Neck supple. No thyromegaly present.  Cardiovascular: Normal rate, regular rhythm and normal heart sounds.   No murmur heard. Pulmonary/Chest: Effort normal. No respiratory distress. She has no wheezes.  Abdominal: Soft. Bowel sounds are normal. She exhibits no distension and no mass. There is no tenderness. There is no guarding.  Musculoskeletal: She exhibits no edema.  Lymphadenopathy:    She has no cervical adenopathy.  Neurological: She is alert and oriented to person, place, and time.  Skin: Skin is warm and dry.  Psychiatric: She has a normal mood and affect. Her behavior is normal.    BP 118/70 mmHg  Pulse 65  Temp(Src) 97.8 F (36.6 C) (Oral)  Ht 5' 4.5" (1.638 m)  Wt 134 lb 4 oz (60.895 kg)  BMI 22.70 kg/m2  SpO2 99% Wt Readings from Last 3 Encounters:  04/29/15 134 lb 4 oz (60.895 kg)  09/18/14 133 lb 6.4 oz (60.51 kg)  04/01/14 134 lb (60.782 kg)     Lab Results  Component Value Date   WBC 3.8* 04/29/2015   HGB 13.1 04/29/2015   HCT 39.1 04/29/2015   PLT 311.0 04/29/2015   GLUCOSE 94 09/18/2014   CHOL 199 09/18/2014   TRIG 65 09/18/2014   HDL 87 09/18/2014   LDLCALC 99 09/18/2014   ALT 30 09/18/2014   AST 28 09/18/2014   NA 136 09/18/2014   K 4.0 09/18/2014   CL 100 09/18/2014   CREATININE 0.73 09/18/2014   BUN 9 09/18/2014   CO2 24 09/18/2014    No results found for: TSH Lab Results  Component Value Date   WBC 3.8* 04/29/2015   HGB 13.1 04/29/2015   HCT 39.1 04/29/2015   MCV 99.0 04/29/2015   PLT 311.0 04/29/2015   Lab Results  Component Value Date   NA 136 09/18/2014   K 4.0 09/18/2014   CO2 24 09/18/2014   GLUCOSE 94 09/18/2014   BUN 9 09/18/2014   CREATININE 0.73 09/18/2014   BILITOT 0.3 09/18/2014   ALKPHOS 48 09/18/2014   AST 28 09/18/2014   ALT 30 09/18/2014   PROT 6.4 09/18/2014   ALBUMIN 4.4  09/18/2014   CALCIUM 9.4 09/18/2014   Lab Results  Component Value Date   CHOL 199 09/18/2014   Lab Results  Component Value Date   HDL 87 09/18/2014   Lab Results  Component Value Date   LDLCALC 99 09/18/2014   Lab Results  Component Value Date   TRIG 65 09/18/2014   Lab Results  Component Value Date   CHOLHDL 2.3 09/18/2014   No results found for: HGBA1C     Assessment & Plan:   Problem List Items Addressed This Visit    Foot lesion    Right foot. Would benefit from Podiatry consultation      Hyperlipidemia, mixed  Tolerating statin, encouraged heart healthy diet, avoid trans fats, minimize simple carbs and saturated fats. Increase exercise as tolerated      HZV (herpes zoster virus) post herpetic neuralgia    She declines Zostavax and Pneumonia vaccine today.      Multilevel degenerative disc disease   Osteopenia    Encouraged to get adequate exercise, calcium and vitamin d intake      Vitamin D deficiency - Primary    Encouraged Vitamin D 2000 IU daily      Relevant Orders   VITAMIN D 25 Hydroxy (Vit-D Deficiency, Fractures) (Completed)    Other Visit Diagnoses    Preventative health care        Relevant Orders    CBC (Completed)    Need for hepatitis C screening test        Relevant Orders    Hepatitis C Antibody (Completed)    Screening for HIV (human immunodeficiency virus)        Relevant Orders    HIV antibody (with reflex) (Completed)       I have discontinued Ms. Doescher's multivitamin. I am also having her maintain her estradiol, VITAMIN E PO, Ascorbic Acid (VITAMIN C PO), Cholecalciferol (VITAMIN D PO), Vitamin-B Complex, simvastatin, b complex vitamins, and CALCIUM-MAGNESIUM PO.  Meds ordered this encounter  Medications  . b complex vitamins tablet    Sig: Take 1 tablet by mouth daily.  Marland Kitchen CALCIUM-MAGNESIUM PO    Sig: Take by mouth daily.    Willette Alma, MD

## 2015-04-30 ENCOUNTER — Telehealth: Payer: Self-pay | Admitting: *Deleted

## 2015-04-30 LAB — HIV ANTIBODY (ROUTINE TESTING W REFLEX): HIV 1&2 Ab, 4th Generation: NONREACTIVE

## 2015-04-30 NOTE — Telephone Encounter (Signed)
Forwarded to Dr. Blyth. JG//CMA  

## 2015-05-04 DIAGNOSIS — L989 Disorder of the skin and subcutaneous tissue, unspecified: Secondary | ICD-10-CM | POA: Insufficient documentation

## 2015-05-04 NOTE — Assessment & Plan Note (Signed)
She declines Zostavax and Pneumonia vaccine today.

## 2015-05-04 NOTE — Assessment & Plan Note (Signed)
Encouraged Vitamin D 2000 IU daily

## 2015-05-04 NOTE — Assessment & Plan Note (Signed)
Tolerating statin, encouraged heart healthy diet, avoid trans fats, minimize simple carbs and saturated fats. Increase exercise as tolerated 

## 2015-05-04 NOTE — Assessment & Plan Note (Signed)
Encouraged to get adequate exercise, calcium and vitamin d intake 

## 2015-05-04 NOTE — Assessment & Plan Note (Signed)
Right foot. Would benefit from Podiatry consultation

## 2015-05-06 ENCOUNTER — Encounter: Payer: Self-pay | Admitting: Family Medicine

## 2015-05-22 DIAGNOSIS — B079 Viral wart, unspecified: Secondary | ICD-10-CM | POA: Diagnosis not present

## 2015-06-12 DIAGNOSIS — L570 Actinic keratosis: Secondary | ICD-10-CM | POA: Diagnosis not present

## 2015-06-12 DIAGNOSIS — B079 Viral wart, unspecified: Secondary | ICD-10-CM | POA: Diagnosis not present

## 2015-07-07 DIAGNOSIS — B079 Viral wart, unspecified: Secondary | ICD-10-CM | POA: Diagnosis not present

## 2015-07-28 DIAGNOSIS — B079 Viral wart, unspecified: Secondary | ICD-10-CM | POA: Diagnosis not present

## 2015-09-23 ENCOUNTER — Ambulatory Visit: Payer: PPO | Admitting: Family Medicine

## 2015-09-25 DIAGNOSIS — Z124 Encounter for screening for malignant neoplasm of cervix: Secondary | ICD-10-CM | POA: Diagnosis not present

## 2015-09-25 DIAGNOSIS — M8580 Other specified disorders of bone density and structure, unspecified site: Secondary | ICD-10-CM | POA: Diagnosis not present

## 2015-09-25 DIAGNOSIS — N952 Postmenopausal atrophic vaginitis: Secondary | ICD-10-CM | POA: Diagnosis not present

## 2015-10-01 DIAGNOSIS — H25813 Combined forms of age-related cataract, bilateral: Secondary | ICD-10-CM | POA: Diagnosis not present

## 2015-10-01 DIAGNOSIS — H04123 Dry eye syndrome of bilateral lacrimal glands: Secondary | ICD-10-CM | POA: Diagnosis not present

## 2015-10-09 ENCOUNTER — Encounter: Payer: Self-pay | Admitting: Family Medicine

## 2015-10-09 ENCOUNTER — Ambulatory Visit (INDEPENDENT_AMBULATORY_CARE_PROVIDER_SITE_OTHER): Payer: PPO | Admitting: Family Medicine

## 2015-10-09 VITALS — BP 120/68 | HR 65 | Temp 98.1°F | Ht 65.0 in | Wt 131.4 lb

## 2015-10-09 DIAGNOSIS — E782 Mixed hyperlipidemia: Secondary | ICD-10-CM | POA: Diagnosis not present

## 2015-10-09 DIAGNOSIS — E559 Vitamin D deficiency, unspecified: Secondary | ICD-10-CM | POA: Diagnosis not present

## 2015-10-09 DIAGNOSIS — E785 Hyperlipidemia, unspecified: Secondary | ICD-10-CM

## 2015-10-09 DIAGNOSIS — Z0000 Encounter for general adult medical examination without abnormal findings: Secondary | ICD-10-CM | POA: Diagnosis not present

## 2015-10-09 DIAGNOSIS — E871 Hypo-osmolality and hyponatremia: Secondary | ICD-10-CM

## 2015-10-09 DIAGNOSIS — M8580 Other specified disorders of bone density and structure, unspecified site: Secondary | ICD-10-CM | POA: Diagnosis not present

## 2015-10-09 MED ORDER — SIMVASTATIN 20 MG PO TABS: 20.0000 mg | ORAL_TABLET | Freq: Every evening | ORAL | Status: DC

## 2015-10-09 NOTE — Progress Notes (Signed)
Pre visit review using our clinic review tool, if applicable. No additional management support is needed unless otherwise documented below in the visit note. 

## 2015-10-09 NOTE — Assessment & Plan Note (Signed)
Labs reveal deficiency. Start on Vitamin D 50000 IU caps, 1 cap po weekly x 12 weeks. Disp #4 with 4 rf. Also take daily Vitamin D over the counter. If already taking a daily supplement increase by 1000 IU daily and if not start Vitamin D 2000 IU daily.  

## 2015-10-09 NOTE — Patient Instructions (Addendum)
Preventive Care for Adults, Female A healthy lifestyle and preventive care can promote health and wellness. Preventive health guidelines for women include the following key practices.  A routine yearly physical is a good way to check with your health care provider about your health and preventive screening. It is a chance to share any concerns and updates on your health and to receive a thorough exam.  Visit your dentist for a routine exam and preventive care every 6 months. Brush your teeth twice a day and floss once a day. Good oral hygiene prevents tooth decay and gum disease.  The frequency of eye exams is based on your age, health, family medical history, use of contact lenses, and other factors. Follow your health care provider's recommendations for frequency of eye exams.  Eat a healthy diet. Foods like vegetables, fruits, whole grains, low-fat dairy products, and lean protein foods contain the nutrients you need without too many calories. Decrease your intake of foods high in solid fats, added sugars, and salt. Eat the right amount of calories for you.Get information about a proper diet from your health care provider, if necessary.  Regular physical exercise is one of the most important things you can do for your health. Most adults should get at least 150 minutes of moderate-intensity exercise (any activity that increases your heart rate and causes you to sweat) each week. In addition, most adults need muscle-strengthening exercises on 2 or more days a week.  Maintain a healthy weight. The body mass index (BMI) is a screening tool to identify possible weight problems. It provides an estimate of body fat based on height and weight. Your health care provider can find your BMI and can help you achieve or maintain a healthy weight.For adults 20 years and older:  A BMI below 18.5 is considered underweight.  A BMI of 18.5 to 24.9 is normal.  A BMI of 25 to 29.9 is considered overweight.  A  BMI of 30 and above is considered obese.  Maintain normal blood lipids and cholesterol levels by exercising and minimizing your intake of saturated fat. Eat a balanced diet with plenty of fruit and vegetables. Blood tests for lipids and cholesterol should begin at age 20 and be repeated every 5 years. If your lipid or cholesterol levels are high, you are over 50, or you are at high risk for heart disease, you may need your cholesterol levels checked more frequently.Ongoing high lipid and cholesterol levels should be treated with medicines if diet and exercise are not working.  If you smoke, find out from your health care provider how to quit. If you do not use tobacco, do not start.  Lung cancer screening is recommended for adults aged 55-80 years who are at high risk for developing lung cancer because of a history of smoking. A yearly low-dose CT scan of the lungs is recommended for people who have at least a 30-pack-year history of smoking and are a current smoker or have quit within the past 15 years. A pack year of smoking is smoking an average of 1 pack of cigarettes a day for 1 year (for example: 1 pack a day for 30 years or 2 packs a day for 15 years). Yearly screening should continue until the smoker has stopped smoking for at least 15 years. Yearly screening should be stopped for people who develop a health problem that would prevent them from having lung cancer treatment.  If you are pregnant, do not drink alcohol. If you are   are breastfeeding, be very cautious about drinking alcohol. If you are not pregnant and choose to drink alcohol, do not have more than 1 drink per day. One drink is considered to be 12 ounces (355 mL) of beer, 5 ounces (148 mL) of wine, or 1.5 ounces (44 mL) of liquor.  Avoid use of street drugs. Do not share needles with anyone. Ask for help if you need support or instructions about stopping the use of drugs.  High blood pressure causes heart disease and  increases the risk of stroke. Your blood pressure should be checked at least every 1 to 2 years. Ongoing high blood pressure should be treated with medicines if weight loss and exercise do not work.  If you are 12-58 years old, ask your health care provider if you should take aspirin to prevent strokes.  Diabetes screening is done by taking a blood sample to check your blood glucose level after you have not eaten for a certain period of time (fasting). If you are not overweight and you do not have risk factors for diabetes, you should be screened once every 3 years starting at age 76. If you are overweight or obese and you are 18-1 years of age, you should be screened for diabetes every year as part of your cardiovascular risk assessment.  Breast cancer screening is essential preventive care for women. You should practice "breast self-awareness." This means understanding the normal appearance and feel of your breasts and may include breast self-examination. Any changes detected, no matter how small, should be reported to a health care provider. Women in their 73s and 30s should have a clinical breast exam (CBE) by a health care provider as part of a regular health exam every 1 to 3 years. After age 36, women should have a CBE every year. Starting at age 50, women should consider having a mammogram (breast X-ray test) every year. Women who have a family history of breast cancer should talk to their health care provider about genetic screening. Women at a high risk of breast cancer should talk to their health care providers about having an MRI and a mammogram every year.  Breast cancer gene (BRCA)-related cancer risk assessment is recommended for women who have family members with BRCA-related cancers. BRCA-related cancers include breast, ovarian, tubal, and peritoneal cancers. Having family members with these cancers may be associated with an increased risk for harmful changes (mutations) in the breast  cancer genes BRCA1 and BRCA2. Results of the assessment will determine the need for genetic counseling and BRCA1 and BRCA2 testing.  Your health care provider may recommend that you be screened regularly for cancer of the pelvic organs (ovaries, uterus, and vagina). This screening involves a pelvic examination, including checking for microscopic changes to the surface of your cervix (Pap test). You may be encouraged to have this screening done every 3 years, beginning at age 40.  For women ages 32-65, health care providers may recommend pelvic exams and Pap testing every 3 years, or they may recommend the Pap and pelvic exam, combined with testing for human papilloma virus (HPV), every 5 years. Some types of HPV increase your risk of cervical cancer. Testing for HPV may also be done on women of any age with unclear Pap test results.  Other health care providers may not recommend any screening for nonpregnant women who are considered low risk for pelvic cancer and who do not have symptoms. Ask your health care provider if a screening pelvic exam is right  you.  If you have had past treatment for cervical cancer or a condition that could lead to cancer, you need Pap tests and screening for cancer for at least 20 years after your treatment. If Pap tests have been discontinued, your risk factors (such as having a new sexual partner) need to be reassessed to determine if screening should resume. Some women have medical problems that increase the chance of getting cervical cancer. In these cases, your health care provider may recommend more frequent screening and Pap tests.  Colorectal cancer can be detected and often prevented. Most routine colorectal cancer screening begins at the age of 50 years and continues through age 75 years. However, your health care provider may recommend screening at an earlier age if you have risk factors for colon cancer. On a yearly basis, your health care provider may provide home test kits to check  for hidden blood in the stool. Use of a small camera at the end of a tube, to directly examine the colon (sigmoidoscopy or colonoscopy), can detect the earliest forms of colorectal cancer. Talk to your health care provider about this at age 50, when routine screening begins. Direct exam of the colon should be repeated every 5-10 years through age 75 years, unless early forms of precancerous polyps or small growths are found.  People who are at an increased risk for hepatitis B should be screened for this virus. You are considered at high risk for hepatitis B if:  You were born in a country where hepatitis B occurs often. Talk with your health care provider about which countries are considered high risk.  Your parents were born in a high-risk country and you have not received a shot to protect against hepatitis B (hepatitis B vaccine).  You have HIV or AIDS.  You use needles to inject street drugs.  You live with, or have sex with, someone who has hepatitis B.  You get hemodialysis treatment.  You take certain medicines for conditions like cancer, organ transplantation, and autoimmune conditions.  Hepatitis C blood testing is recommended for all people born from 1945 through 1965 and any individual with known risks for hepatitis C.  Practice safe sex. Use condoms and avoid high-risk sexual practices to reduce the spread of sexually transmitted infections (STIs). STIs include gonorrhea, chlamydia, syphilis, trichomonas, herpes, HPV, and human immunodeficiency virus (HIV). Herpes, HIV, and HPV are viral illnesses that have no cure. They can result in disability, cancer, and death.  You should be screened for sexually transmitted illnesses (STIs) including gonorrhea and chlamydia if:  You are sexually active and are younger than 24 years.  You are older than 24 years and your health care provider tells you that you are at risk for this type of infection.  Your sexual activity has changed  since you were last screened and you are at an increased risk for chlamydia or gonorrhea. Ask your health care provider if you are at risk.  If you are at risk of being infected with HIV, it is recommended that you take a prescription medicine daily to prevent HIV infection. This is called preexposure prophylaxis (PrEP). You are considered at risk if:  You are sexually active and do not regularly use condoms or know the HIV status of your partner(s).  You take drugs by injection.  You are sexually active with a partner who has HIV.  Talk with your health care provider about whether you are at high risk of being infected with HIV. If   you choose to begin PrEP, you should first be tested for HIV. You should then be tested every 3 months for as long as you are taking PrEP.  Osteoporosis is a disease in which the bones lose minerals and strength with aging. This can result in serious bone fractures or breaks. The risk of osteoporosis can be identified using a bone density scan. Women ages 65 years and over and women at risk for fractures or osteoporosis should discuss screening with their health care providers. Ask your health care provider whether you should take a calcium supplement or vitamin D to reduce the rate of osteoporosis.  Menopause can be associated with physical symptoms and risks. Hormone replacement therapy is available to decrease symptoms and risks. You should talk to your health care provider about whether hormone replacement therapy is right for you.  Use sunscreen. Apply sunscreen liberally and repeatedly throughout the day. You should seek shade when your shadow is shorter than you. Protect yourself by wearing long sleeves, pants, a wide-brimmed hat, and sunglasses year round, whenever you are outdoors.  Once a month, do a whole body skin exam, using a mirror to look at the skin on your back. Tell your health care provider of new moles, moles that have irregular borders, moles that  are larger than a pencil eraser, or moles that have changed in shape or color.  Stay current with required vaccines (immunizations).  Influenza vaccine. All adults should be immunized every year.  Tetanus, diphtheria, and acellular pertussis (Td, Tdap) vaccine. Pregnant women should receive 1 dose of Tdap vaccine during each pregnancy. The dose should be obtained regardless of the length of time since the last dose. Immunization is preferred during the 27th-36th week of gestation. An adult who has not previously received Tdap or who does not know her vaccine status should receive 1 dose of Tdap. This initial dose should be followed by tetanus and diphtheria toxoids (Td) booster doses every 10 years. Adults with an unknown or incomplete history of completing a 3-dose immunization series with Td-containing vaccines should begin or complete a primary immunization series including a Tdap dose. Adults should receive a Td booster every 10 years.  Varicella vaccine. An adult without evidence of immunity to varicella should receive 2 doses or a second dose if she has previously received 1 dose. Pregnant females who do not have evidence of immunity should receive the first dose after pregnancy. This first dose should be obtained before leaving the health care facility. The second dose should be obtained 4-8 weeks after the first dose.  Human papillomavirus (HPV) vaccine. Females aged 13-26 years who have not received the vaccine previously should obtain the 3-dose series. The vaccine is not recommended for use in pregnant females. However, pregnancy testing is not needed before receiving a dose. If a female is found to be pregnant after receiving a dose, no treatment is needed. In that case, the remaining doses should be delayed until after the pregnancy. Immunization is recommended for any person with an immunocompromised condition through the age of 26 years if she did not get any or all doses earlier. During the  3-dose series, the second dose should be obtained 4-8 weeks after the first dose. The third dose should be obtained 24 weeks after the first dose and 16 weeks after the second dose.  Zoster vaccine. One dose is recommended for adults aged 60 years or older unless certain conditions are present.  Measles, mumps, and rubella (MMR) vaccine. Adults born   born before [birth year] generally are considered immune to measles and mumps. Adults born in [birth year] or later should have 1 or more doses of MMR vaccine unless there is a contraindication to the vaccine or there is laboratory evidence of immunity to each of the three diseases. A routine second dose of MMR vaccine should be obtained at least 28 days after the first dose for students attending postsecondary schools, health care workers, or international travelers. People who received inactivated measles vaccine or an unknown type of measles vaccine during 1963-1967 should receive 2 doses of MMR vaccine. People who received inactivated mumps vaccine or an unknown type of mumps vaccine before 1979 and are at high risk for mumps infection should consider immunization with 2 doses of MMR vaccine. For females of childbearing age, rubella immunity should be determined. If there is no evidence of immunity, females who are not pregnant should be vaccinated. If there is no evidence of immunity, females who are pregnant should delay immunization until after pregnancy. Unvaccinated health care workers born before [birth year] who lack laboratory evidence of measles, mumps, or rubella immunity or laboratory confirmation of disease should consider measles and mumps immunization with 2 doses of MMR vaccine or rubella immunization with 1 dose of MMR vaccine.  Pneumococcal 13-valent conjugate (PCV13) vaccine. When indicated, a person who is uncertain of his immunization history and has no record of immunization should receive the PCV13 vaccine. All adults 62 years of age and older  should receive this vaccine. An adult aged 39 years or older who has certain medical conditions and has not been previously immunized should receive 1 dose of PCV13 vaccine. This PCV13 should be followed with a dose of pneumococcal polysaccharide (PPSV23) vaccine. Adults who are at high risk for pneumococcal disease should obtain the PPSV23 vaccine at least 8 weeks after the dose of PCV13 vaccine. Adults older than 69 years of age who have normal immune system function should obtain the PPSV23 vaccine dose at least 1 year after the dose of PCV13 vaccine.  Pneumococcal polysaccharide (PPSV23) vaccine. When PCV13 is also indicated, PCV13 should be obtained first. All adults aged 47 years and older should be immunized. An adult younger than age 27 years who has certain medical conditions should be immunized. Any person who resides in a nursing home or long-term care facility should be immunized. An adult smoker should be immunized. People with an immunocompromised condition and certain other conditions should receive both PCV13 and PPSV23 vaccines. People with human immunodeficiency virus (HIV) infection should be immunized as soon as possible after diagnosis. Immunization during chemotherapy or radiation therapy should be avoided. Routine use of PPSV23 vaccine is not recommended for American Indians, [place] Natives, or people younger than 65 years unless there are medical conditions that require PPSV23 vaccine. When indicated, people who have unknown immunization and have no record of immunization should receive PPSV23 vaccine. One-time revaccination 5 years after the first dose of PPSV23 is recommended for people aged 19-64 years who have chronic kidney failure, nephrotic syndrome, asplenia, or immunocompromised conditions. People who received 1-2 doses of PPSV23 before age 57 years should receive another dose of PPSV23 vaccine at age 42 years or later if at least 5 years have passed since the previous dose. Doses  of PPSV23 are not needed for people immunized with PPSV23 at or after age 51 years.  Meningococcal vaccine. Adults with asplenia or persistent complement component deficiencies should receive 2 doses of quadrivalent meningococcal conjugate (MenACWY-D) vaccine. The doses should be  at least 2 months apart. Microbiologists working with certain meningococcal bacteria, military recruits, people at risk during an outbreak, and people who travel to or live in countries with a high rate of meningitis should be immunized. A first-year college student up through age 21 years who is living in a residence hall should receive a dose if she did not receive a dose on or after her 16th birthday. Adults who have certain high-risk conditions should receive one or more doses of vaccine.  Hepatitis A vaccine. Adults who wish to be protected from this disease, have certain high-risk conditions, work with hepatitis A-infected animals, work in hepatitis A research labs, or travel to or work in countries with a high rate of hepatitis A should be immunized. Adults who were previously unvaccinated and who anticipate close contact with an international adoptee during the first 60 days after arrival in the [place] from a country with a high rate of hepatitis A should be immunized.  Hepatitis B vaccine. Adults who wish to be protected from this disease, have certain high-risk conditions, may be exposed to blood or other infectious body fluids, are household contacts or sex partners of hepatitis B positive people, are clients or workers in certain care facilities, or travel to or work in countries with a high rate of hepatitis B should be immunized.  Haemophilus influenzae type b (Hib) vaccine. A previously unvaccinated person with asplenia or sickle cell disease or having a scheduled splenectomy should receive 1 dose of Hib vaccine. Regardless of previous immunization, a recipient of a hematopoietic stem cell transplant should receive a  3-dose series 6-12 months after her successful transplant. Hib vaccine is not recommended for adults with HIV infection. Preventive Services / Frequency Ages 19 to 39 years  Blood pressure check.** / Every 3-5 years.  Lipid and cholesterol check.** / Every 5 years beginning at age 20.  Clinical breast exam.** / Every 3 years for women in their 20s and 30s.  BRCA-related cancer risk assessment.** / For women who have family members with a BRCA-related cancer (breast, ovarian, tubal, or peritoneal cancers).  Pap test.** / Every 2 years from ages 21 through 29. Every 3 years starting at age 30 through age 65 or 70 with a history of 3 consecutive normal Pap tests.  HPV screening.** / Every 3 years from ages 30 through ages 65 to 70 with a history of 3 consecutive normal Pap tests.  Hepatitis C blood test.** / For any individual with known risks for hepatitis C.  Skin self-exam. / Monthly.  Influenza vaccine. / Every year.  Tetanus, diphtheria, and acellular pertussis (Tdap, Td) vaccine.** / Consult your health care provider. Pregnant women should receive 1 dose of Tdap vaccine during each pregnancy. 1 dose of Td every 10 years.  Varicella vaccine.** / Consult your health care provider. Pregnant females who do not have evidence of immunity should receive the first dose after pregnancy.  HPV vaccine. / 3 doses over 6 months, if 26 and younger. The vaccine is not recommended for use in pregnant females. However, pregnancy testing is not needed before receiving a dose.  Measles, mumps, rubella (MMR) vaccine.** / You need at least 1 dose of MMR if you were born in 1957 or later. You may also need a 2nd dose. For females of childbearing age, rubella immunity should be determined. If there is no evidence of immunity, females who are not pregnant should be vaccinated. If there is no evidence of immunity, females who are   pregnant should delay immunization until after pregnancy.  Pneumococcal  13-valent conjugate (PCV13) vaccine.** / Consult your health care provider.  Pneumococcal polysaccharide (PPSV23) vaccine.** / 1 to 2 doses if you smoke cigarettes or if you have certain conditions.  Meningococcal vaccine.** / 1 dose if you are age 19 to 21 years and a first-year college student living in a residence hall, or have one of several medical conditions, you need to get vaccinated against meningococcal disease. You may also need additional booster doses.  Hepatitis A vaccine.** / Consult your health care provider.  Hepatitis B vaccine.** / Consult your health care provider.  Haemophilus influenzae type b (Hib) vaccine.** / Consult your health care provider. Ages 40 to 64 years  Blood pressure check.** / Every year.  Lipid and cholesterol check.** / Every 5 years beginning at age 20 years.  Lung cancer screening. / Every year if you are aged 55-80 years and have a 30-pack-year history of smoking and currently smoke or have quit within the past 15 years. Yearly screening is stopped once you have quit smoking for at least 15 years or develop a health problem that would prevent you from having lung cancer treatment.  Clinical breast exam.** / Every year after age 40 years.  BRCA-related cancer risk assessment.** / For women who have family members with a BRCA-related cancer (breast, ovarian, tubal, or peritoneal cancers).  Mammogram.** / Every year beginning at age 40 years and continuing for as long as you are in good health. Consult with your health care provider.  Pap test.** / Every 3 years starting at age 30 years through age 65 or 70 years with a history of 3 consecutive normal Pap tests.  HPV screening.** / Every 3 years from ages 30 years through ages 65 to 70 years with a history of 3 consecutive normal Pap tests.  Fecal occult blood test (FOBT) of stool. / Every year beginning at age 50 years and continuing until age 75 years. You may not need to do this test if you get  a colonoscopy every 10 years.  Flexible sigmoidoscopy or colonoscopy.** / Every 5 years for a flexible sigmoidoscopy or every 10 years for a colonoscopy beginning at age 50 years and continuing until age 75 years.  Hepatitis C blood test.** / For all people born from 1945 through 1965 and any individual with known risks for hepatitis C.  Skin self-exam. / Monthly.  Influenza vaccine. / Every year.  Tetanus, diphtheria, and acellular pertussis (Tdap/Td) vaccine.** / Consult your health care provider. Pregnant women should receive 1 dose of Tdap vaccine during each pregnancy. 1 dose of Td every 10 years.  Varicella vaccine.** / Consult your health care provider. Pregnant females who do not have evidence of immunity should receive the first dose after pregnancy.  Zoster vaccine.** / 1 dose for adults aged 60 years or older.  Measles, mumps, rubella (MMR) vaccine.** / You need at least 1 dose of MMR if you were born in 1957 or later. You may also need a second dose. For females of childbearing age, rubella immunity should be determined. If there is no evidence of immunity, females who are not pregnant should be vaccinated. If there is no evidence of immunity, females who are pregnant should delay immunization until after pregnancy.  Pneumococcal 13-valent conjugate (PCV13) vaccine.** / Consult your health care provider.  Pneumococcal polysaccharide (PPSV23) vaccine.** / 1 to 2 doses if you smoke cigarettes or if you have certain conditions.  Meningococcal vaccine.** /   Consult your health care provider.  Hepatitis A vaccine.** / Consult your health care provider.  Hepatitis B vaccine.** / Consult your health care provider.  Haemophilus influenzae type b (Hib) vaccine.** / Consult your health care provider. Ages 80 years and over  Blood pressure check.** / Every year.  Lipid and cholesterol check.** / Every 5 years beginning at age 62 years.  Lung cancer  screening. / Every year if you are aged 32-80 years and have a 30-pack-year history of smoking and currently smoke or have quit within the past 15 years. Yearly screening is stopped once you have quit smoking for at least 15 years or develop a health problem that would prevent you from having lung cancer treatment.  Clinical breast exam.** / Every year after age 61 years.  BRCA-related cancer risk assessment.** / For women who have family members with a BRCA-related cancer (breast, ovarian, tubal, or peritoneal cancers).  Mammogram.** / Every year beginning at age 39 years and continuing for as long as you are in good health. Consult with your health care provider.  Pap test.** / Every 3 years starting at age 85 years through age 74 or 72 years with 3 consecutive normal Pap tests. Testing can be stopped between 65 and 70 years with 3 consecutive normal Pap tests and no abnormal Pap or HPV tests in the past 10 years.  HPV screening.** / Every 3 years from ages 55 years through ages 67 or 77 years with a history of 3 consecutive normal Pap tests. Testing can be stopped between 65 and 70 years with 3 consecutive normal Pap tests and no abnormal Pap or HPV tests in the past 10 years.  Fecal occult blood test (FOBT) of stool. / Every year beginning at age 81 years and continuing until age 22 years. You may not need to do this test if you get a colonoscopy every 10 years.  Flexible sigmoidoscopy or colonoscopy.** / Every 5 years for a flexible sigmoidoscopy or every 10 years for a colonoscopy beginning at age 67 years and continuing until age 22 years.  Hepatitis C blood test.** / For all people born from [birth year] through 1964 and any individual with known risks for hepatitis C.  Osteoporosis screening.** / A one-time screening for women ages 8 years and over and women at risk for fractures or osteoporosis.  Skin self-exam. / Monthly.  Influenza vaccine. / Every year.  Tetanus, diphtheria, and  acellular pertussis (Tdap/Td) vaccine.** / 1 dose of Td every 10 years.  Varicella vaccine.** / Consult your health care provider.  Zoster vaccine.** / 1 dose for adults aged 56 years or older.  Pneumococcal 13-valent conjugate (PCV13) vaccine.** / Consult your health care provider.  Pneumococcal polysaccharide (PPSV23) vaccine.** / 1 dose for all adults aged 15 years and older.  Meningococcal vaccine.** / Consult your health care provider.  Hepatitis A vaccine.** / Consult your health care provider.  Hepatitis B vaccine.** / Consult your health care provider.  Haemophilus influenzae type b (Hib) vaccine.** / Consult your health care provider. ** Family history and personal history of risk and conditions may change your health care provider's recommendations.   This information is not intended to replace advice given to you by your health care provider. Make sure you discuss any questions you have with your health care provider.   Document Released: 05/25/2001 Document Revised: 04/19/2014 Document Reviewed: 08/24/2010 Elsevier Interactive Patient Education [employer].

## 2015-10-10 LAB — LIPID PANEL
Cholesterol: 226 mg/dL — ABNORMAL HIGH (ref 0–200)
HDL: 85.9 mg/dL (ref >39.00)
LDL Cholesterol: 127 mg/dL — ABNORMAL HIGH (ref 0–99)
NonHDL: 139.88
TRIGLYCERIDES: 63 mg/dL (ref 0.0–149.0)
Total CHOL/HDL Ratio: 3
VLDL: 12.6 mg/dL (ref 0.0–40.0)

## 2015-10-10 LAB — VITAMIN D 25 HYDROXY (VIT D DEFICIENCY, FRACTURES): VITD: 52.52 ng/mL (ref 30.00–100.00)

## 2015-10-10 LAB — CBC
HCT: 39.2 % (ref 36.0–46.0)
Hemoglobin: 13.2 g/dL (ref 12.0–15.0)
MCHC: 33.7 g/dL (ref 30.0–36.0)
MCV: 97 fl (ref 78.0–100.0)
Platelets: 290 10*3/uL (ref 150.0–400.0)
RBC: 4.04 Mil/uL (ref 3.87–5.11)
RDW: 13.3 % (ref 11.5–15.5)
WBC: 5 10*3/uL (ref 4.0–10.5)

## 2015-10-10 LAB — COMPREHENSIVE METABOLIC PANEL
ALK PHOS: 38 U/L — AB (ref 39–117)
ALT: 26 U/L (ref 0–35)
AST: 26 U/L (ref 0–37)
Albumin: 4.5 g/dL (ref 3.5–5.2)
BILIRUBIN TOTAL: 0.7 mg/dL (ref 0.2–1.2)
BUN: 10 mg/dL (ref 6–23)
CALCIUM: 9.6 mg/dL (ref 8.4–10.5)
CO2: 30 mEq/L (ref 19–32)
CREATININE: 0.69 mg/dL (ref 0.40–1.20)
Chloride: 95 mEq/L — ABNORMAL LOW (ref 96–112)
GFR: 89.58 mL/min (ref >60.00)
GLUCOSE: 93 mg/dL (ref 70–99)
Potassium: 3.8 mEq/L (ref 3.5–5.1)
Sodium: 130 mEq/L — ABNORMAL LOW (ref 135–145)
TOTAL PROTEIN: 7 g/dL (ref 6.0–8.3)

## 2015-10-10 LAB — TSH: TSH: 1.13 u[IU]/mL (ref 0.35–4.50)

## 2015-10-12 ENCOUNTER — Encounter: Payer: Self-pay | Admitting: Family Medicine

## 2015-10-12 DIAGNOSIS — Z0000 Encounter for general adult medical examination without abnormal findings: Secondary | ICD-10-CM | POA: Insufficient documentation

## 2015-10-12 DIAGNOSIS — E871 Hypo-osmolality and hyponatremia: Secondary | ICD-10-CM | POA: Insufficient documentation

## 2015-10-12 HISTORY — DX: Hypo-osmolality and hyponatremia: E87.1

## 2015-10-12 NOTE — Assessment & Plan Note (Signed)
Patient encouraged to maintain heart healthy diet, regular exercise, adequate sleep. Consider daily probiotics. Take medications as prescribed. Given and reviewed copy of ACP documents from [employer] and encouraged to complete and return. Labs reviewed. Follows with GYN for paps and mgms, Dr Lisbeth Renshaw. Also follows with Dr Katy Fitch of opthamology.

## 2015-10-12 NOTE — Progress Notes (Signed)
Patient ID: Courtney Shaffer, female   DOB: 1946/01/15, 70 y.o.   MRN: [id number]   Subjective:    Patient ID: Courtney Shaffer, female    DOB: May 27, 1945, 70 y.o.   MRN: [id number]  Chief Complaint  Patient presents with  . Annual Exam    HPI Patient is in today for annual exam and follow up on numerous concerns. Is following with OB/GYN Dr Lisbeth Renshaw and is following with Dr Katy Fitch for visual concerns. Is doing well at this time. No recent illness. No recent hospitalizations. Is doing well with ADLs and trying to stay active. Follows a heart healthy diet. Denies CP/palp/SOB/HA/congestion/fevers/GI or GU c/o. Taking meds as prescribed  Past Medical History  Diagnosis Date  . Osteopenia   . Osteopenia 08/22/2013  . Multilevel degenerative disc disease 04/29/2015    knees, back  . History of chicken pox   . Hyponatremia 10/12/2015    Past Surgical History  Procedure Laterality Date  . Appendectomy    . Knee arthroscopy Bilateral     Wainer left, Collins right  . Colonoscopy  11/20/2003    w/Dr.William Austin=diverticulosis&hemorrhoids  . Adenoidectomy      Family History  Problem Relation Age of Onset  . Osteoporosis Mother   . Mental illness Mother     depression  . Hypertension Brother   . Hyperlipidemia Brother   . Diabetes Father   . Hyperlipidemia Father   . Hypertension Father   . Colon cancer Neg Hx   . Mental illness Daughter     anxiety depression  . Mental illness Maternal Grandmother   . Osteoporosis Maternal Grandmother   . Mental illness Maternal Grandfather   . Mental illness Maternal Aunt   . Mental illness Maternal Uncle     Social History   Social History  . Marital Status: Married    Spouse Name: N/A  . Number of Children: N/A  . Years of Education: N/A   Occupational History  . yoga teacher    Social History Main Topics  . Smoking status: Never Smoker   . Smokeless tobacco: Never Used  . Alcohol Use: No  . Drug Use: No  . Sexual Activity: Yes     Comment: lives with husband, yoga instructor, no dietary restrictions   Other Topics Concern  . Not on file   Social History Narrative   Married. Education: [employer]. 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 Ascorbic Acid (VITAMIN C PO) Take 1 tablet by mouth daily. Reported on 10/09/2015    . B Complex Vitamins (VITAMIN-B COMPLEX) TABS Take 1 tablet by mouth daily. Reported on 10/09/2015    . b complex vitamins tablet Take 1 tablet by mouth daily. Reported on 10/09/2015    . CALCIUM-MAGNESIUM PO Take by mouth daily. Reported on 10/09/2015    . Cholecalciferol (VITAMIN D PO) Take 1 tablet by mouth daily. Reported on 10/09/2015    . estradiol (ESTRACE) 0.1 MG/GM vaginal cream Place 2 g vaginally. Reported on 10/09/2015    . VITAMIN E PO Take 1 capsule by mouth daily. Reported on 10/09/2015    . simvastatin (ZOCOR) 20 MG tablet Take 1 tablet (20 mg total) by mouth every evening. (Patient not taking: Reported on 10/09/2015) 90 tablet 3   No facility-administered medications prior to visit.    Allergies  Allergen Reactions  . Codeine     Review of Systems  Constitutional: Negative for fever and malaise/fatigue.  HENT: Negative for congestion.   Eyes: Negative for blurred vision.  Respiratory: Negative for shortness of breath.   Cardiovascular: Negative for chest pain, palpitations and leg swelling.  Gastrointestinal: Negative for nausea, abdominal pain and blood in stool.  Genitourinary: Negative for dysuria and frequency.  Musculoskeletal: Negative for falls.  Skin: Negative for rash.  Neurological: Negative for dizziness, loss of consciousness and headaches.  Endo/Heme/Allergies: Negative for environmental allergies.  Psychiatric/Behavioral: Negative for depression. The patient is not nervous/anxious.        Objective:      Physical Exam  Constitutional: She is oriented to person, place, and time. She appears well-developed and well-nourished. No distress.  HENT:  Head: Normocephalic and atraumatic.  Right Ear: External ear normal.  Left Ear: External ear normal.  Nose: Nose normal.  Mouth/Throat: Oropharynx is clear and moist.  Eyes: Conjunctivae and EOM are normal. Pupils are equal, round, and reactive to light. Right eye exhibits no discharge. Left eye exhibits no discharge.  Neck: Normal range of motion. Neck supple. No JVD present. No thyromegaly present.  Cardiovascular: Normal rate, regular rhythm, normal heart sounds and intact distal pulses.   No murmur heard. Pulmonary/Chest: Effort normal and breath sounds normal. No respiratory distress. She has no wheezes. She has no rales. She exhibits no tenderness.  Abdominal: Soft. Bowel sounds are normal. She exhibits no distension and no mass. There is no tenderness. There is no rebound and no guarding.  Genitourinary: Vagina normal and uterus normal. Guaiac negative stool. No vaginal discharge found.  Musculoskeletal: Normal range of motion. She exhibits no edema or tenderness.  Lymphadenopathy:    She has no cervical adenopathy.  Neurological: She is alert and oriented to person, place, and time. She has normal reflexes. No cranial nerve deficit.  Skin: Skin is warm and dry. No rash noted. She is not diaphoretic. No erythema.  Psychiatric: She has a normal mood and affect. Her behavior is normal. Judgment and thought content normal.  Nursing note and vitals reviewed.   BP 120/68 mmHg  Pulse 65  Temp(Src) 98.1 F (36.7 C) (Oral)  Ht 5\' 5"  (1.651 m)  Wt 131 lb 6 oz (59.591 kg)  BMI 21.86 kg/m2  SpO2 97% Wt Readings from Last 3 Encounters:  10/09/15 131 lb 6 oz (59.591 kg)  04/29/15 134 lb 4 oz (60.895 kg)  09/18/14 133 lb 6.4 oz (60.51 kg)     Lab Results  Component Value Date   WBC 5.0 10/09/2015   HGB 13.2 10/09/2015   HCT 39.2  10/09/2015   PLT 290.0 10/09/2015   GLUCOSE 93 10/09/2015   CHOL 226* 10/09/2015   TRIG 63.0 10/09/2015   HDL 85.90 10/09/2015   LDLCALC 127* 10/09/2015   ALT 26 10/09/2015   AST 26 10/09/2015   NA 130* 10/09/2015   K 3.8 10/09/2015   CL 95* 10/09/2015   CREATININE 0.69 10/09/2015   BUN 10 10/09/2015   CO2 30 10/09/2015   TSH 1.13 10/09/2015    Lab Results  Component Value Date   TSH 1.13 10/09/2015   Lab Results  Component Value Date   WBC 5.0 10/09/2015   HGB 13.2 10/09/2015   HCT 39.2 10/09/2015   MCV 97.0 10/09/2015   PLT 290.0 10/09/2015   Lab Results  Component Value Date   NA 130* 10/09/2015   K 3.8 10/09/2015   CO2  30 10/09/2015   GLUCOSE 93 10/09/2015   BUN 10 10/09/2015   CREATININE 0.69 10/09/2015   BILITOT 0.7 10/09/2015   ALKPHOS 38* 10/09/2015   AST 26 10/09/2015   ALT 26 10/09/2015   PROT 7.0 10/09/2015   ALBUMIN 4.5 10/09/2015   CALCIUM 9.6 10/09/2015   GFR 89.58 10/09/2015   Lab Results  Component Value Date   CHOL 226* 10/09/2015   Lab Results  Component Value Date   HDL 85.90 10/09/2015   Lab Results  Component Value Date   LDLCALC 127* 10/09/2015   Lab Results  Component Value Date   TRIG 63.0 10/09/2015   Lab Results  Component Value Date   CHOLHDL 3 10/09/2015   No results found for: HGBA1C     Assessment & Plan:   Problem List Items Addressed This Visit    Vitamin D deficiency    Labs reveal deficiency. Start on Vitamin D 50000 IU caps, 1 cap po weekly x 12 weeks. Disp #4 with 4 rf. Also take daily Vitamin D over the counter. If already taking a daily supplement increase by 1000 IU daily and if not start Vitamin D 2000 IU daily.       Relevant Orders   TSH (Completed)   CBC (Completed)   Lipid panel (Completed)   Comprehensive metabolic panel (Completed)   VITAMIN D 25 Hydroxy (Vit-D Deficiency, Fractures) (Completed)   Osteopenia    Encouraged to get adequate exercise, calcium and vitamin d intake       Relevant Orders   TSH (Completed)   CBC (Completed)   Lipid panel (Completed)   Comprehensive metabolic panel (Completed)   VITAMIN D 25 Hydroxy (Vit-D Deficiency, Fractures) (Completed)   Hyperlipidemia, mixed    Encouraged heart healthy diet, increase exercise, avoid trans fats, consider a krill oil cap daily      Relevant Medications   simvastatin (ZOCOR) 20 MG tablet   Preventative health care    Patient encouraged to maintain heart healthy diet, regular exercise, adequate sleep. Consider daily probiotics. Take medications as prescribed. Given and reviewed copy of ACP documents from [employer] and encouraged to complete and return. Labs reviewed. Follows with GYN for paps and mgms, Dr Lisbeth Renshaw. Also follows with Dr Katy Fitch of opthamology.      Relevant Orders   TSH (Completed)   CBC (Completed)   Lipid panel (Completed)   Comprehensive metabolic panel (Completed)   VITAMIN D 25 Hydroxy (Vit-D Deficiency, Fractures) (Completed)   Hyponatremia    Mild, asymptomatic, decrease beverages by one serving daily. recheck       Other Visit Diagnoses    Hyperlipidemia    -  Primary    Relevant Medications    simvastatin (ZOCOR) 20 MG tablet    Other Relevant Orders    TSH (Completed)    CBC (Completed)    Lipid panel (Completed)    Comprehensive metabolic panel (Completed)    VITAMIN D 25 Hydroxy (Vit-D Deficiency, Fractures) (Completed)       I am having Ms. Loveridge maintain her estradiol, VITAMIN E PO, Ascorbic Acid (VITAMIN C PO), Cholecalciferol (VITAMIN D PO), Vitamin-B Complex, b complex vitamins, CALCIUM-MAGNESIUM PO, simvastatin, folic acid, and Probiotic Product (PROBIOTIC DAILY PO).  Meds ordered this encounter  Medications  . simvastatin (ZOCOR) 20 MG tablet    Sig: Take 1 tablet (20 mg total) by mouth every evening.    Dispense:  90 tablet    Refill:  3  . folic  acid (FOLVITE) 1 MG tablet    Sig: Take 1 mg by mouth daily.  . Probiotic Product (PROBIOTIC  DAILY PO)    Sig: Take by mouth.     Penni Homans, MD

## 2015-10-12 NOTE — Assessment & Plan Note (Signed)
Encouraged to get adequate exercise, calcium and vitamin d intake 

## 2015-10-12 NOTE — Assessment & Plan Note (Signed)
Mild, asymptomatic, decrease beverages by one serving daily. recheck

## 2015-10-12 NOTE — Assessment & Plan Note (Signed)
Encouraged heart healthy diet, increase exercise, avoid trans fats, consider a krill oil cap daily 

## 2015-10-13 ENCOUNTER — Telehealth: Payer: Self-pay | Admitting: Family Medicine

## 2015-10-13 ENCOUNTER — Other Ambulatory Visit: Payer: Self-pay | Admitting: Family Medicine

## 2015-10-13 DIAGNOSIS — E871 Hypo-osmolality and hyponatremia: Secondary | ICD-10-CM

## 2015-10-13 NOTE — Telephone Encounter (Signed)
See lab results.  

## 2015-10-13 NOTE — Telephone Encounter (Signed)
Pt returned call for lab results.   CB: [phone] (cell)

## 2015-10-15 ENCOUNTER — Telehealth: Payer: Self-pay | Admitting: *Deleted

## 2015-10-15 NOTE — Telephone Encounter (Signed)
Forwarded to Dr. Aletha Halim. JG//CMA

## 2015-10-16 ENCOUNTER — Encounter: Payer: Self-pay | Admitting: Family Medicine

## 2015-11-10 ENCOUNTER — Other Ambulatory Visit (INDEPENDENT_AMBULATORY_CARE_PROVIDER_SITE_OTHER): Payer: PPO

## 2015-11-10 DIAGNOSIS — E871 Hypo-osmolality and hyponatremia: Secondary | ICD-10-CM | POA: Diagnosis not present

## 2015-11-10 LAB — COMPREHENSIVE METABOLIC PANEL
ALT: 24 U/L (ref 0–35)
AST: 23 U/L (ref 0–37)
Albumin: 4.4 g/dL (ref 3.5–5.2)
Alkaline Phosphatase: 41 U/L (ref 39–117)
BUN: 8 mg/dL (ref 6–23)
CALCIUM: 9.6 mg/dL (ref 8.4–10.5)
CHLORIDE: 99 meq/L (ref 96–112)
CO2: 29 meq/L (ref 19–32)
CREATININE: 0.77 mg/dL (ref 0.40–1.20)
GFR: 78.91 mL/min (ref >60.00)
Glucose, Bld: 99 mg/dL (ref 70–99)
POTASSIUM: 3.9 meq/L (ref 3.5–5.1)
SODIUM: 135 meq/L (ref 135–145)
Total Bilirubin: 0.5 mg/dL (ref 0.2–1.2)
Total Protein: 6.8 g/dL (ref 6.0–8.3)

## 2015-11-25 ENCOUNTER — Encounter: Payer: PPO | Admitting: Family Medicine

## 2015-12-18 DIAGNOSIS — B079 Viral wart, unspecified: Secondary | ICD-10-CM | POA: Diagnosis not present

## 2016-01-08 DIAGNOSIS — B079 Viral wart, unspecified: Secondary | ICD-10-CM | POA: Diagnosis not present

## 2016-01-22 DIAGNOSIS — B079 Viral wart, unspecified: Secondary | ICD-10-CM | POA: Diagnosis not present

## 2016-02-03 ENCOUNTER — Ambulatory Visit (INDEPENDENT_AMBULATORY_CARE_PROVIDER_SITE_OTHER): Payer: PPO | Admitting: Family Medicine

## 2016-02-03 ENCOUNTER — Encounter: Payer: Self-pay | Admitting: Family Medicine

## 2016-02-03 DIAGNOSIS — E559 Vitamin D deficiency, unspecified: Secondary | ICD-10-CM | POA: Diagnosis not present

## 2016-02-03 DIAGNOSIS — E782 Mixed hyperlipidemia: Secondary | ICD-10-CM

## 2016-02-03 DIAGNOSIS — G8929 Other chronic pain: Secondary | ICD-10-CM | POA: Insufficient documentation

## 2016-02-03 DIAGNOSIS — R6884 Jaw pain: Secondary | ICD-10-CM

## 2016-02-03 DIAGNOSIS — M8580 Other specified disorders of bone density and structure, unspecified site: Secondary | ICD-10-CM

## 2016-02-03 HISTORY — DX: Other chronic pain: G89.29

## 2016-02-03 NOTE — Assessment & Plan Note (Signed)
Continue daily supplements 

## 2016-02-03 NOTE — Assessment & Plan Note (Signed)
Encouraged heart healthy diet, increase exercise, avoid trans fats, consider a krill oil cap daily 

## 2016-02-03 NOTE — Assessment & Plan Note (Addendum)
Is working with her dentist and has been using a mouth guard for several weeks and that has been helping some and then has a different mouth guard she uses in am. Encouraged to continue care with her dentist and consider MRI if symptoms worsen.

## 2016-02-03 NOTE — Patient Instructions (Signed)

## 2016-02-03 NOTE — Progress Notes (Signed)
Pre visit review using our clinic review tool, if applicable. No additional management support is needed unless otherwise documented below in the visit note. 

## 2016-02-08 NOTE — Progress Notes (Signed)
Patient ID: Courtney Shaffer, female   DOB: 1946-02-10, 70 y.o.   MRN: [id number]   Subjective:    Patient ID: Courtney Shaffer, female    DOB: Apr 11, 1946, 70 y.o.   MRN: [id number]  Chief Complaint  Patient presents with  . Jaw Pain    HPI Patient is in today for evaluation of chronic jaw pain. She has been working with her dentist and uses 2 different mouth guards to help realign her jaw. Unfortunately she still has daily pain. Denies CP/palp/SOB/HA/congestion/fevers/GI or GU c/o. Taking meds as prescribed  Past Medical History:  Diagnosis Date  . 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 History of chicken pox   . Hyponatremia 10/12/2015  . Multilevel degenerative disc disease 04/29/2015   knees, back  . Osteopenia   . Osteopenia 08/22/2013    Past Surgical History:  Procedure Laterality Date  . ADENOIDECTOMY    . APPENDECTOMY    . COLONOSCOPY  11/20/2003   w/Dr.William Austin=diverticulosis&hemorrhoids  . KNEE ARTHROSCOPY Bilateral    Wainer left, Collins right    Family History  Problem Relation Age of Onset  . Osteoporosis Mother   . Mental illness Mother     depression  . Hypertension Brother   . Hyperlipidemia Brother   . Diabetes Father   . Hyperlipidemia Father   . Hypertension Father   . Mental illness Daughter     anxiety depression  . Mental illness Maternal Grandmother   . Osteoporosis Maternal Grandmother   . Mental illness Maternal Grandfather   . Mental illness Maternal Aunt   . Mental illness Maternal Uncle   . Vision loss Son   . Colon cancer Neg Hx     Social History   Social History  . Marital status: Married    Spouse name: N/A  . Number of children: N/A  . Years of education: N/A   Occupational History  . yoga teacher    Social History Main Topics  . Smoking status: Never Smoker  . Smokeless tobacco: Never Used  . Alcohol use No  . Drug use: No  . 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 Not on file   Social History Narrative   Married. Education: [employer]. 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 Ascorbic Acid (VITAMIN C PO) Take 1 tablet by mouth daily. Reported on 10/09/2015    . B Complex Vitamins (VITAMIN-B COMPLEX) TABS Take 1 tablet by mouth daily. Reported on 10/09/2015    . b complex vitamins tablet Take 1 tablet by mouth daily. Reported on 10/09/2015    . CALCIUM-MAGNESIUM PO Take by mouth daily. Reported on 10/09/2015    . Cholecalciferol (VITAMIN D PO) Take 1 tablet by mouth daily. Reported on 10/09/2015    . estradiol (ESTRACE) 0.1 MG/GM vaginal cream Place 2 g vaginally. Reported on [id number]    . folic acid (FOLVITE) 1 MG tablet Take 1 mg by mouth daily.    . Probiotic Product (PROBIOTIC DAILY PO) Take by mouth.    . simvastatin (ZOCOR) 20 MG tablet Take 1 tablet (20 mg total) by mouth every evening. 90 tablet 3  . VITAMIN E PO Take 1 capsule by mouth daily. Reported on 10/09/2015  No facility-administered medications prior to visit.     Allergies  Allergen Reactions  . Codeine     Review of Systems  Constitutional: Negative for fever and malaise/fatigue.  HENT: Negative for congestion.   Eyes: Negative for blurred vision.  Respiratory: Negative for shortness of breath.   Cardiovascular: Negative for chest pain, palpitations and leg swelling.  Gastrointestinal: Negative for abdominal pain, blood in stool and nausea.  Genitourinary: Negative for dysuria and frequency.  Musculoskeletal: Positive for joint pain. Negative for falls.  Skin: Negative for rash.  Neurological: Negative for dizziness, loss of consciousness and headaches.  Endo/Heme/Allergies: Negative for environmental allergies.  Psychiatric/Behavioral: Negative for depression. The patient is not nervous/anxious.         Objective:    Physical Exam  Constitutional: She is oriented to person, place, and time. She appears well-developed and well-nourished. No distress.  HENT:  Head: Normocephalic and atraumatic.  Nose: Nose normal.  Eyes: Right eye exhibits no discharge. Left eye exhibits no discharge.  Neck: Normal range of motion. Neck supple.  Cardiovascular: Normal rate and regular rhythm.   No murmur heard. Pulmonary/Chest: Effort normal and breath sounds normal.  Abdominal: Soft. Bowel sounds are normal. There is no tenderness.  Musculoskeletal: She exhibits no edema.  Neurological: She is alert and oriented to person, place, and time.  Skin: Skin is warm and dry.  Psychiatric: She has a normal mood and affect.  Nursing note and vitals reviewed.   BP 120/68 (BP Location: Left Arm, Patient Position: Sitting, Cuff Size: Normal)   Pulse 68   Temp 97.7 F (36.5 C) (Oral)   Ht 5\' 5"  (1.651 m)   Wt 133 lb 8 oz (60.6 kg)   SpO2 95%   BMI 22.22 kg/m  Wt Readings from Last 3 Encounters:  02/03/16 133 lb 8 oz (60.6 kg)  10/09/15 131 lb 6 oz (59.6 kg)  04/29/15 134 lb 4 oz (60.9 kg)     Lab Results  Component Value Date   WBC 5.0 10/09/2015   HGB 13.2 10/09/2015   HCT 39.2 10/09/2015   PLT 290.0 10/09/2015   GLUCOSE 99 11/10/2015   CHOL 226 (H) 10/09/2015   TRIG 63.0 10/09/2015   HDL 85.90 10/09/2015   LDLCALC 127 (H) 10/09/2015   ALT 24 11/10/2015   AST 23 11/10/2015   NA 135 11/10/2015   K 3.9 11/10/2015   CL 99 11/10/2015   CREATININE 0.77 11/10/2015   BUN 8 11/10/2015   CO2 29 11/10/2015   TSH 1.13 10/09/2015    Lab Results  Component Value Date   TSH 1.13 10/09/2015   Lab Results  Component Value Date   WBC 5.0 10/09/2015   HGB 13.2 10/09/2015   HCT 39.2 10/09/2015   MCV 97.0 10/09/2015   PLT 290.0 10/09/2015   Lab Results  Component Value Date   NA 135 11/10/2015   K 3.9 11/10/2015   CO2 29 11/10/2015   GLUCOSE 99 11/10/2015   BUN 8 11/10/2015    CREATININE 0.77 11/10/2015   BILITOT 0.5 11/10/2015   ALKPHOS 41 11/10/2015   AST 23 11/10/2015   ALT 24 11/10/2015   PROT 6.8 11/10/2015   ALBUMIN 4.4 11/10/2015   CALCIUM 9.6 11/10/2015   GFR 78.91 11/10/2015   Lab Results  Component Value Date   CHOL 226 (H) 10/09/2015   Lab Results  Component Value Date   HDL 85.90 10/09/2015   Lab Results  Component Value Date   LDLCALC 127 (  H) 10/09/2015   Lab Results  Component Value Date   TRIG 63.0 10/09/2015   Lab Results  Component Value Date   CHOLHDL 3 10/09/2015   No results found for: HGBA1C     Assessment & Plan:   Problem List Items Addressed This Visit    Vitamin D deficiency    Continue daily supplements      Osteopenia    Encouraged to get adequate exercise, calcium and vitamin d intake      Hyperlipidemia, mixed    Encouraged heart healthy diet, increase exercise, avoid trans fats, consider a krill oil cap daily      Chronic jaw pain    Is working with her dentist and has been using a mouth guard for several weeks and that has been helping some and then has a different mouth guard she uses in am. Encouraged to continue care with her dentist and consider MRI if symptoms worsen.        Other Visit Diagnoses   None.     I am having Ms. Loree maintain her estradiol, VITAMIN E PO, Ascorbic Acid (VITAMIN C PO), Cholecalciferol (VITAMIN D PO), Vitamin-B Complex, b complex vitamins, CALCIUM-MAGNESIUM PO, simvastatin, folic acid, and Probiotic Product (PROBIOTIC DAILY PO).  No orders of the defined types were placed in this encounter.    Penni Homans, MD

## 2016-02-08 NOTE — Assessment & Plan Note (Signed)
Encouraged to get adequate exercise, calcium and vitamin d intake 

## 2016-02-09 ENCOUNTER — Other Ambulatory Visit: Payer: Self-pay | Admitting: Family Medicine

## 2016-02-09 ENCOUNTER — Telehealth: Payer: Self-pay | Admitting: Family Medicine

## 2016-02-09 DIAGNOSIS — R51 Headache: Principal | ICD-10-CM

## 2016-02-09 DIAGNOSIS — R519 Headache, unspecified: Secondary | ICD-10-CM

## 2016-02-09 NOTE — Telephone Encounter (Signed)
-----   Message from Mosie Lukes, MD sent at 02/08/2016  7:05 PM EDT ----- She wanted an MRI and a CT scan of her jaw, we discussed the problems with payors. Will order the CT first and then the MRI if she still wishes to proceed. Please just confirm before I order.

## 2016-02-09 NOTE — Telephone Encounter (Signed)
Patient informed of instructions.  She is ok for PCP to proceed with CT first

## 2016-02-10 ENCOUNTER — Telehealth: Payer: Self-pay | Admitting: Family Medicine

## 2016-02-10 NOTE — Telephone Encounter (Signed)
Called the patient left a detailed message of below information. Paperwork is at the front ready for pickup/copied/scanned original.

## 2016-02-10 NOTE — Telephone Encounter (Signed)
-----   Message from Mosie Lukes, MD sent at 02/09/2016  9:48 PM EDT ----- I have ordered her CT scan please let her know she should pick up the papers she left about ordering, they say she should bring them with her. Also make [place] a copy before she takes them thanks

## 2016-02-11 ENCOUNTER — Encounter: Payer: Self-pay | Admitting: Family Medicine

## 2016-02-13 ENCOUNTER — Encounter: Payer: Self-pay | Admitting: Family Medicine

## 2016-02-17 ENCOUNTER — Other Ambulatory Visit: Payer: Self-pay | Admitting: Family Medicine

## 2016-02-17 DIAGNOSIS — R51 Headache: Principal | ICD-10-CM

## 2016-02-17 DIAGNOSIS — R519 Headache, unspecified: Secondary | ICD-10-CM

## 2016-02-18 ENCOUNTER — Ambulatory Visit
Admission: RE | Admit: 2016-02-18 | Discharge: 2016-02-18 | Disposition: A | Discharge status: Home / Self Care | Payer: PPO | Source: Ambulatory Visit | Attending: Family Medicine | Admitting: Family Medicine

## 2016-02-18 DIAGNOSIS — R519 Headache, unspecified: Secondary | ICD-10-CM

## 2016-02-18 DIAGNOSIS — R6884 Jaw pain: Secondary | ICD-10-CM | POA: Diagnosis not present

## 2016-02-18 DIAGNOSIS — R51 Headache: Principal | ICD-10-CM

## 2016-02-19 DIAGNOSIS — B079 Viral wart, unspecified: Secondary | ICD-10-CM | POA: Diagnosis not present

## 2016-03-01 ENCOUNTER — Ambulatory Visit
Admission: RE | Admit: 2016-03-01 | Discharge: 2016-03-01 | Disposition: A | Discharge status: Home / Self Care | Payer: PPO | Source: Ambulatory Visit | Attending: Family Medicine | Admitting: Family Medicine

## 2016-03-01 DIAGNOSIS — R51 Headache: Principal | ICD-10-CM

## 2016-03-01 DIAGNOSIS — M26601 Right temporomandibular joint disorder, unspecified: Secondary | ICD-10-CM | POA: Diagnosis not present

## 2016-03-01 DIAGNOSIS — R519 Headache, unspecified: Secondary | ICD-10-CM

## 2016-03-11 DIAGNOSIS — B079 Viral wart, unspecified: Secondary | ICD-10-CM | POA: Diagnosis not present

## 2016-03-18 DIAGNOSIS — Z1231 Encounter for screening mammogram for malignant neoplasm of breast: Secondary | ICD-10-CM | POA: Diagnosis not present

## 2016-03-18 LAB — HM MAMMOGRAPHY

## 2016-03-26 ENCOUNTER — Encounter: Payer: Self-pay | Admitting: Family Medicine

## 2016-05-04 ENCOUNTER — Telehealth: Payer: Self-pay | Admitting: Family Medicine

## 2016-05-04 NOTE — Telephone Encounter (Signed)
Called patient to schedule awv. Left msg for patient to call office to schedule appt.  °

## 2016-05-17 DIAGNOSIS — B079 Viral wart, unspecified: Secondary | ICD-10-CM | POA: Diagnosis not present

## 2016-06-17 DIAGNOSIS — L570 Actinic keratosis: Secondary | ICD-10-CM | POA: Diagnosis not present

## 2016-06-17 DIAGNOSIS — D049 Carcinoma in situ of skin, unspecified: Secondary | ICD-10-CM | POA: Diagnosis not present

## 2016-06-17 DIAGNOSIS — B079 Viral wart, unspecified: Secondary | ICD-10-CM | POA: Diagnosis not present

## 2016-06-29 ENCOUNTER — Telehealth: Payer: Self-pay | Admitting: Family Medicine

## 2016-06-29 NOTE — Telephone Encounter (Signed)
Left pt message asking to call Allison back directly at [phone] to schedule AWV. Thanks! °

## 2016-07-29 DIAGNOSIS — B070 Plantar wart: Secondary | ICD-10-CM | POA: Diagnosis not present

## 2016-08-14 NOTE — Telephone Encounter (Signed)
Scheduled 12/23/16

## 2016-08-26 DIAGNOSIS — L84 Corns and callosities: Secondary | ICD-10-CM | POA: Diagnosis not present

## 2016-08-26 DIAGNOSIS — B079 Viral wart, unspecified: Secondary | ICD-10-CM | POA: Diagnosis not present

## 2016-10-12 ENCOUNTER — Other Ambulatory Visit: Payer: Self-pay | Admitting: *Deleted

## 2016-10-12 DIAGNOSIS — E785 Hyperlipidemia, unspecified: Secondary | ICD-10-CM

## 2016-10-12 MED ORDER — SIMVASTATIN 20 MG PO TABS: 20.0000 mg | ORAL_TABLET | Freq: Every evening | ORAL | 0 refills | Status: DC

## 2016-10-14 ENCOUNTER — Encounter: Payer: PPO | Admitting: Family Medicine

## 2016-10-19 DIAGNOSIS — Z01419 Encounter for gynecological examination (general) (routine) without abnormal findings: Secondary | ICD-10-CM | POA: Diagnosis not present

## 2016-11-16 NOTE — Telephone Encounter (Signed)
AWV time changed to 8 with [facility]

## 2016-12-15 NOTE — Progress Notes (Signed)
Subjective:   Courtney Shaffer is a 71 y.o. female who presents for Medicare Annual (Subsequent) preventive examination.  Review of Systems:  No ROS.  Medicare Wellness Visit. Additional risk factors are reflected in the social history.  Cardiac Risk Factors include: advanced age (>28men, >62 women);dyslipidemia Sleep patterns: "light sleeper"  Sleeps about 7 hrs. Feels rested. Home Safety/Smoke Alarms: Feels safe in home. Smoke alarms in place.  Living environment; residence and Adult nurse: lives with husband. Seat Belt Safety/Bike Helmet: Wears seat belt.   Female:        Mammo- 03/18/16      Dexa scan-   03/18/15 osteopenia     CCS- 04/01/14. Recall 69yrs     Objective:     Vitals: BP 127/68 (BP Location: Left Arm, Patient Position: Sitting, Cuff Size: Normal)   Pulse 70   Ht 5\' 5"  (1.651 m)   Wt 126 lb 9.6 oz (57.4 kg)   SpO2 99%   BMI 21.07 kg/m   Body mass index is 21.07 kg/m.   Tobacco History  Smoking Status  . Never Smoker  Smokeless Tobacco  . Never Used     Counseling given: Not Answered   Past Medical History:  Diagnosis Date  . 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 History of chicken pox   . Hyponatremia 10/12/2015  . Multilevel degenerative disc disease 04/29/2015   knees, back  . Osteopenia   . Osteopenia 08/22/2013  . TMJ syndrome    Past Surgical History:  Procedure Laterality Date  . ADENOIDECTOMY    . APPENDECTOMY    . COLONOSCOPY  11/20/2003   w/Dr.William Austin=diverticulosis&hemorrhoids  . KNEE ARTHROSCOPY Bilateral    Wainer left, Collins right   Family History  Problem Relation Age of Onset  . Osteoporosis Mother   . Mental illness Mother        depression  . Hypertension Brother   . Hyperlipidemia Brother   . Diabetes Father   . Hyperlipidemia Father   . Hypertension Father   . Mental illness Daughter        anxiety depression  . Mental illness Maternal Grandmother   . Osteoporosis Maternal Grandmother   . Mental illness  Maternal Grandfather   . Mental illness Maternal Aunt   . Mental illness Maternal Uncle   . Vision Shaffer Son   . Colon cancer Neg Hx    History  Sexual Activity  . 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 Ascorbic Acid (VITAMIN C PO) Take 1 tablet by mouth daily. Reported on 10/09/2015  . B Complex Vitamins (VITAMIN-B COMPLEX) TABS Take 1 tablet by mouth daily. Reported on 10/09/2015  . CALCIUM-MAGNESIUM PO Take by mouth daily. Reported on 10/09/2015  . Cholecalciferol (VITAMIN D PO) Take 1 tablet by mouth daily. Reported on 10/09/2015  . estradiol (ESTRACE) 0.1 MG/GM vaginal cream Place 2 g vaginally. Reported on 0/86/7619  . folic acid (FOLVITE) 1 MG tablet Take 1 mg by mouth daily.  . Probiotic Product (PROBIOTIC DAILY PO) Take by mouth.  . simvastatin (ZOCOR) 20 MG tablet Take 1 tablet (20 mg total) by mouth every evening.  Marland Kitchen VITAMIN E PO Take 1 capsule by mouth daily. Reported on 10/09/2015  . [DISCONTINUED] b complex vitamins tablet Take 1 tablet by mouth daily. Reported on 10/09/2015   No facility-administered encounter medications on file as of 12/23/2016.  Activities of Daily Living In your present state of health, do you have any difficulty performing the following activities: 12/23/2016  Hearing? N  Vision? N  Comment wearing glasses. Dr.Groat yearly.  Difficulty concentrating or making decisions? N  Walking or climbing stairs? N  Dressing or bathing? N  Doing errands, shopping? N  Preparing Food and eating ? N  Using the Toilet? N  In the past six months, have you accidently leaked urine? N  Do you have problems with Shaffer of bowel control? N  Managing your Medications? N  Managing your Finances? N  Housekeeping or managing your Housekeeping? N  Some recent data might be hidden    Patient Care Team: Mosie Lukes, MD as PCP - General  (Family Medicine)    Assessment:    Physical assessment deferred to PCP.  Exercise Activities and Dietary recommendations Current Exercise Habits: Structured exercise class, Type of exercise: yoga;walking, Frequency (Times/Week): 7 Diet (meal preparation, eat out, water intake, caffeinated beverages, dairy products, fruits and vegetables): in general, a "healthy" diet       Goals      Patient Stated   . Maintain current healthy lifestyle (pt-stated)      Fall Risk Fall Risk  12/23/2016 10/09/2015 09/18/2014 08/22/2013  Falls in the past year? No No No No   Depression Screen PHQ 2/9 Scores 12/23/2016 10/09/2015 09/18/2014 08/22/2013  PHQ - 2 Score 0 0 0 0     Cognitive Function        Immunization History  Administered Date(s) Administered  . Tdap 02/15/2007   Screening Tests Health Maintenance  Topic Date Due  . INFLUENZA VACCINE  02/28/2017 (Originally 11/10/2016)  . PNA vac Low Risk Adult (1 of 2 - PCV13) 12/23/2017 (Originally 06/28/2011)  . TETANUS/TDAP  02/14/2017  . MAMMOGRAM  03/18/2018  . COLONOSCOPY  04/01/2024  . DEXA SCAN  Completed  . Hepatitis C Screening  Completed      Plan:   Follow up with PCP today as scheduled.  Continue to eat heart healthy diet (full of fruits, vegetables, whole grains, lean protein, water--limit salt, fat, and sugar intake) and increase physical activity as tolerated.  Continue doing brain stimulating activities (puzzles, reading, adult coloring books, staying active) to keep memory sharp.    I have personally reviewed and noted the following in the patient's chart:   . Medical and social history . Use of alcohol, tobacco or illicit drugs  . Current medications and supplements . Functional ability and status . Nutritional status . Physical activity . Advanced directives . List of other physicians . Hospitalizations, surgeries, and ER visits in previous 12 months . Vitals . Screenings to include cognitive, depression, and  falls . Referrals and appointments  In addition, I have reviewed and discussed with patient certain preventive protocols, quality metrics, and best practice recommendations. A written personalized care plan for preventive services as well as general preventive health recommendations were provided to patient.     Shela Nevin, [place]  12/23/2016

## 2016-12-23 ENCOUNTER — Ambulatory Visit (INDEPENDENT_AMBULATORY_CARE_PROVIDER_SITE_OTHER): Payer: PPO | Admitting: Family Medicine

## 2016-12-23 ENCOUNTER — Encounter: Payer: Self-pay | Admitting: Family Medicine

## 2016-12-23 VITALS — BP 127/68 | HR 70 | Ht 65.0 in | Wt 126.6 lb

## 2016-12-23 DIAGNOSIS — M2669 Other specified disorders of temporomandibular joint: Secondary | ICD-10-CM

## 2016-12-23 DIAGNOSIS — M26609 Unspecified temporomandibular joint disorder, unspecified side: Secondary | ICD-10-CM | POA: Diagnosis not present

## 2016-12-23 DIAGNOSIS — G8929 Other chronic pain: Secondary | ICD-10-CM

## 2016-12-23 DIAGNOSIS — E782 Mixed hyperlipidemia: Secondary | ICD-10-CM | POA: Diagnosis not present

## 2016-12-23 DIAGNOSIS — M8580 Other specified disorders of bone density and structure, unspecified site: Secondary | ICD-10-CM

## 2016-12-23 DIAGNOSIS — M26629 Arthralgia of temporomandibular joint, unspecified side: Secondary | ICD-10-CM

## 2016-12-23 DIAGNOSIS — E871 Hypo-osmolality and hyponatremia: Secondary | ICD-10-CM

## 2016-12-23 DIAGNOSIS — M26649 Arthritis of unspecified temporomandibular joint: Secondary | ICD-10-CM | POA: Insufficient documentation

## 2016-12-23 DIAGNOSIS — Z0000 Encounter for general adult medical examination without abnormal findings: Secondary | ICD-10-CM | POA: Diagnosis not present

## 2016-12-23 DIAGNOSIS — E559 Vitamin D deficiency, unspecified: Secondary | ICD-10-CM

## 2016-12-23 DIAGNOSIS — R6884 Jaw pain: Secondary | ICD-10-CM

## 2016-12-23 HISTORY — DX: Arthralgia of temporomandibular joint, unspecified side: M26.629

## 2016-12-23 LAB — COMPREHENSIVE METABOLIC PANEL
ALBUMIN: 4.3 g/dL (ref 3.5–5.2)
ALK PHOS: 42 U/L (ref 39–117)
ALT: 24 U/L (ref 0–35)
AST: 25 U/L (ref 0–37)
BUN: 12 mg/dL (ref 6–23)
CO2: 30 mEq/L (ref 19–32)
Calcium: 9.8 mg/dL (ref 8.4–10.5)
Chloride: 97 mEq/L (ref 96–112)
Creatinine, Ser: 0.71 mg/dL (ref 0.40–1.20)
GFR: 86.38 mL/min (ref >60.00)
GLUCOSE: 91 mg/dL (ref 70–99)
Potassium: 4.5 mEq/L (ref 3.5–5.1)
SODIUM: 132 meq/L — AB (ref 135–145)
TOTAL PROTEIN: 6.6 g/dL (ref 6.0–8.3)
Total Bilirubin: 0.5 mg/dL (ref 0.2–1.2)

## 2016-12-23 LAB — CBC
HEMATOCRIT: 42.3 % (ref 36.0–46.0)
HEMOGLOBIN: 14.4 g/dL (ref 12.0–15.0)
MCHC: 34 g/dL (ref 30.0–36.0)
MCV: 98.9 fl (ref 78.0–100.0)
Platelets: 330 10*3/uL (ref 150.0–400.0)
RBC: 4.28 Mil/uL (ref 3.87–5.11)
RDW: 12.8 % (ref 11.5–15.5)
WBC: 5.4 10*3/uL (ref 4.0–10.5)

## 2016-12-23 LAB — LIPID PANEL
CHOLESTEROL: 197 mg/dL (ref 0–200)
HDL: 102.1 mg/dL (ref >39.00)
LDL CALC: 79 mg/dL (ref 0–99)
NonHDL: 94.83
Total CHOL/HDL Ratio: 2
Triglycerides: 79 mg/dL (ref 0.0–149.0)
VLDL: 15.8 mg/dL (ref 0.0–40.0)

## 2016-12-23 LAB — TSH: TSH: 0.79 u[IU]/mL (ref 0.35–4.50)

## 2016-12-23 LAB — VITAMIN D 25 HYDROXY (VIT D DEFICIENCY, FRACTURES): VITD: 51.28 ng/mL (ref 30.00–100.00)

## 2016-12-23 NOTE — Assessment & Plan Note (Signed)
Check level today, takes Vitamin D 2000 IU daily

## 2016-12-23 NOTE — Assessment & Plan Note (Signed)
Following closely with dentist and she is using some discs to hold the jaws in place. The discs help without them she cannot chew.

## 2016-12-23 NOTE — Assessment & Plan Note (Signed)
Check cmp 

## 2016-12-23 NOTE — Assessment & Plan Note (Addendum)
Patient encouraged to maintain heart healthy diet, regular exercise, adequate sleep. Consider daily probiotics. Take medications as prescribed. Declines pneumonia shots, tetanus, shingles, flu shots. Has HCP documents in chart

## 2016-12-23 NOTE — Patient Instructions (Addendum)
Bone density shows osteopenia, which is thinner than normal but not as bad as osteoporosis. Recommend calcium intake of 1200 to 1500 mg daily, divided into roughly 3 doses. Best source is the diet and a single dairy serving is about 500 mg, a supplement of calcium citrate once or twice daily to balance diet is fine if not getting enough in diet. Also need Vitamin D 2000 IU caps, 1 cap daily if not already taking vitamin D. Also recommend weight baring exercise on hips and upper body to keep bones strong  [name] , Thank you for taking time to come for your Medicare Wellness Visit. I appreciate your ongoing commitment to your health goals. Please review the following plan we discussed and let me know if I can assist you in the future.   These are the goals we discussed: Goals      Patient Stated   . Maintain current healthy lifestyle (pt-stated)       This is a list of the screening recommended for you and due dates:  Health Maintenance  Topic Date Due  . Flu Shot  02/28/2017*  . Pneumonia vaccines (1 of 2 - PCV13) 12/23/2017*  . Tetanus Vaccine  02/14/2017  . Mammogram  03/18/2018  . Colon Cancer Screening  04/01/2024  . DEXA scan (bone density measurement)  Completed  .  Hepatitis C: One time screening is recommended by Center for Disease Control  (CDC) for  adults born from [birth year] through 1964.   Completed  *Topic was postponed. The date shown is not the original due date.   Continue to eat heart healthy diet (full of fruits, vegetables, whole grains, lean protein, water--limit salt, fat, and sugar intake) and increase physical activity as tolerated.  Continue doing brain stimulating activities (puzzles, reading, adult coloring books, staying active) to keep memory sharp.    Health Maintenance for Postmenopausal Women Menopause is a normal process in which your reproductive ability comes to an end. This process happens gradually over a span of months to years, usually between the  ages of 68 and 66. Menopause is complete when you have missed 12 consecutive menstrual periods. It is important to talk with your health care provider about some of the most common conditions that affect postmenopausal women, such as heart disease, cancer, and bone loss (osteoporosis). Adopting a healthy lifestyle and getting preventive care can help to promote your health and wellness. Those actions can also lower your chances of developing some of these common conditions. What should I know about menopause? During menopause, you may experience a number of symptoms, such as:  Moderate-to-severe hot flashes.  Night sweats.  Decrease in sex drive.  Mood swings.  Headaches.  Tiredness.  Irritability.  Memory problems.  Insomnia.  Choosing to treat or not to treat menopausal changes is an individual decision that you make with your health care provider. What should I know about hormone replacement therapy and supplements? Hormone therapy products are effective for treating symptoms that are associated with menopause, such as hot flashes and night sweats. Hormone replacement carries certain risks, especially as you become older. If you are thinking about using estrogen or estrogen with progestin treatments, discuss the benefits and risks with your health care provider. What should I know about heart disease and stroke? Heart disease, heart attack, and stroke become more likely as you age. This may be due, in part, to the hormonal changes that your body experiences during menopause. These can affect how your body processes  dietary fats, triglycerides, and cholesterol. Heart attack and stroke are both medical emergencies. There are many things that you can do to help prevent heart disease and stroke:  Have your blood pressure checked at least every 1-2 years. High blood pressure causes heart disease and increases the risk of stroke.  If you are 43-10 years old, ask your health care provider  if you should take aspirin to prevent a heart attack or a stroke.  Do not use any tobacco products, including cigarettes, chewing tobacco, or electronic cigarettes. If you need help quitting, ask your health care provider.  It is important to eat a healthy diet and maintain a healthy weight. ? Be sure to include plenty of vegetables, fruits, low-fat dairy products, and lean protein. ? Avoid eating foods that are high in solid fats, added sugars, or salt (sodium).  Get regular exercise. This is one of the most important things that you can do for your health. ? Try to exercise for at least 150 minutes each week. The type of exercise that you do should increase your heart rate and make you sweat. This is known as moderate-intensity exercise. ? Try to do strengthening exercises at least twice each week. Do these in addition to the moderate-intensity exercise.  Know your numbers.Ask your health care provider to check your cholesterol and your blood glucose. Continue to have your blood tested as directed by your health care provider.  What should I know about cancer screening? There are several types of cancer. Take the following steps to reduce your risk and to catch any cancer development as early as possible. Breast Cancer  Practice breast self-awareness. ? This means understanding how your breasts normally appear and feel. ? It also means doing regular breast self-exams. Let your health care provider know about any changes, no matter how small.  If you are 87 or older, have a clinician do a breast exam (clinical breast exam or CBE) every year. Depending on your age, family history, and medical history, it may be recommended that you also have a yearly breast X-ray (mammogram).  If you have a family history of breast cancer, talk with your health care provider about genetic screening.  If you are at high risk for breast cancer, talk with your health care provider about having an MRI and a  mammogram every year.  Breast cancer (BRCA) gene test is recommended for women who have family members with BRCA-related cancers. Results of the assessment will determine the need for genetic counseling and BRCA1 and for BRCA2 testing. BRCA-related cancers include these types: ? Breast. This occurs in males or females. ? Ovarian. ? Tubal. This may also be called fallopian tube cancer. ? Cancer of the abdominal or pelvic lining (peritoneal cancer). ? Prostate. ? Pancreatic.  Cervical, Uterine, and Ovarian Cancer Your health care provider may recommend that you be screened regularly for cancer of the pelvic organs. These include your ovaries, uterus, and vagina. This screening involves a pelvic exam, which includes checking for microscopic changes to the surface of your cervix (Pap test).  For women ages 21-65, health care providers may recommend a pelvic exam and a Pap test every three years. For women ages 7-65, they may recommend the Pap test and pelvic exam, combined with testing for human papilloma virus (HPV), every five years. Some types of HPV increase your risk of cervical cancer. Testing for HPV may also be done on women of any age who have unclear Pap test results.  Other  health care providers may not recommend any screening for nonpregnant women who are considered low risk for pelvic cancer and have no symptoms. Ask your health care provider if a screening pelvic exam is right for you.  If you have had past treatment for cervical cancer or a condition that could lead to cancer, you need Pap tests and screening for cancer for at least 20 years after your treatment. If Pap tests have been discontinued for you, your risk factors (such as having a new sexual partner) need to be reassessed to determine if you should start having screenings again. Some women have medical problems that increase the chance of getting cervical cancer. In these cases, your health care provider may recommend that  you have screening and Pap tests more often.  If you have a family history of uterine cancer or ovarian cancer, talk with your health care provider about genetic screening.  If you have vaginal bleeding after reaching menopause, tell your health care provider.  There are currently no reliable tests available to screen for ovarian cancer.  Lung Cancer Lung cancer screening is recommended for adults 41-37 years old who are at high risk for lung cancer because of a history of smoking. A yearly low-dose CT scan of the lungs is recommended if you:  Currently smoke.  Have a history of at least 30 pack-years of smoking and you currently smoke or have quit within the past 15 years. A pack-year is smoking an average of one pack of cigarettes per day for one year.  Yearly screening should:  Continue until it has been 15 years since you quit.  Stop if you develop a health problem that would prevent you from having lung cancer treatment.  Colorectal Cancer  This type of cancer can be detected and can often be prevented.  Routine colorectal cancer screening usually begins at age 58 and continues through age 83.  If you have risk factors for colon cancer, your health care provider may recommend that you be screened at an earlier age.  If you have a family history of colorectal cancer, talk with your health care provider about genetic screening.  Your health care provider may also recommend using home test kits to check for hidden blood in your stool.  A small camera at the end of a tube can be used to examine your colon directly (sigmoidoscopy or colonoscopy). This is done to check for the earliest forms of colorectal cancer.  Direct examination of the colon should be repeated every 5-10 years until age 78. However, if early forms of precancerous polyps or small growths are found or if you have a family history or genetic risk for colorectal cancer, you may need to be screened more  often.  Skin Cancer  Check your skin from head to toe regularly.  Monitor any moles. Be sure to tell your health care provider: ? About any new moles or changes in moles, especially if there is a change in a mole's shape or color. ? If you have a mole that is larger than the size of a pencil eraser.  If any of your family members has a history of skin cancer, especially at a young age, talk with your health care provider about genetic screening.  Always use sunscreen. Apply sunscreen liberally and repeatedly throughout the day.  Whenever you are outside, protect yourself by wearing long sleeves, pants, a wide-brimmed hat, and sunglasses.  What should I know about osteoporosis? Osteoporosis is a condition in  which bone destruction happens more quickly than new bone creation. After menopause, you may be at an increased risk for osteoporosis. To help prevent osteoporosis or the bone fractures that can happen because of osteoporosis, the following is recommended:  If you are 50-57 years old, get at least 1,000 mg of calcium and at least 600 mg of vitamin D per day.  If you are older than age 63 but younger than age 12, get at least 1,200 mg of calcium and at least 600 mg of vitamin D per day.  If you are older than age 39, get at least 1,200 mg of calcium and at least 800 mg of vitamin D per day.  Smoking and excessive alcohol intake increase the risk of osteoporosis. Eat foods that are rich in calcium and vitamin D, and do weight-bearing exercises several times each week as directed by your health care provider. What should I know about how menopause affects my mental health? Depression may occur at any age, but it is more common as you become older. Common symptoms of depression include:  Low or sad mood.  Changes in sleep patterns.  Changes in appetite or eating patterns.  Feeling an overall lack of motivation or enjoyment of activities that you previously enjoyed.  Frequent  crying spells.  Talk with your health care provider if you think that you are experiencing depression. What should I know about immunizations? It is important that you get and maintain your immunizations. These include:  Tetanus, diphtheria, and pertussis (Tdap) booster vaccine.  Influenza every year before the flu season begins.  Pneumonia vaccine.  Shingles vaccine.  Your health care provider may also recommend other immunizations. This information is not intended to replace advice given to you by your health care provider. Make sure you discuss any questions you have with your health care provider. Document Released: 05/21/2005 Document Revised: 10/17/2015 Document Reviewed: 12/31/2014 Elsevier Interactive Patient Education  2018 [employer].

## 2016-12-23 NOTE — Assessment & Plan Note (Signed)
Encouraged heart healthy diet, increase exercise, avoid trans fats, consider a krill oil cap daily 

## 2016-12-26 NOTE — Progress Notes (Signed)
Patient ID: Courtney Shaffer, female   DOB: Nov 03, 1945, 71 y.o.   MRN: [id number]   Subjective:    Patient ID: Courtney Shaffer, female    DOB: 05-23-1945, 71 y.o.   MRN: [id number]  Chief Complaint  Patient presents with  . Medicare Wellness    with RN    HPI Patient is in today for annual preventative exam and to follow up on chronic medical concerns. She declines immunizations. Today. No recent febrile illness or hospitalizations. She is following with her orthodontist for her jaw pain. The splinting is very helpful while she is using it but then pain returns. Denies CP/palp/SOB/HA/congestion/fevers/GI or GU c/o. Taking meds as prescribed. She maintains a heart healthy diet and stays active. Does well with her ADLs at home.   Past Medical History:  Diagnosis Date  . 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 History of chicken pox   . Hyponatremia 10/12/2015  . Multilevel degenerative disc disease 04/29/2015   knees, back  . Osteopenia   . Osteopenia 08/22/2013  . 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 TMJ syndrome     Past Surgical History:  Procedure Laterality Date  . ADENOIDECTOMY    . APPENDECTOMY    . COLONOSCOPY  11/20/2003   w/Dr.William Austin=diverticulosis&hemorrhoids  . KNEE ARTHROSCOPY Bilateral    [place] left, Collins right    Family History  Problem Relation Age of Onset  . Osteoporosis Mother   . Mental illness Mother        depression  . Hypertension Brother   . Hyperlipidemia Brother   . Diabetes Father   . Hyperlipidemia Father   . Hypertension Father   . Mental illness Daughter        anxiety depression  . Mental illness Maternal Grandmother   . Osteoporosis Maternal Grandmother   . Mental illness Maternal Grandfather   . Mental illness Maternal Aunt   . Mental illness Maternal Uncle   . Vision loss Son   . Colon cancer Neg Hx     Social History   Social History  . Marital status: Married    Spouse name: N/A  . Number of children: N/A  . Years of education: N/A     Occupational History  . yoga teacher    Social History Main Topics  . Smoking status: Never Smoker  . Smokeless tobacco: Never Used  . Alcohol use No  . Drug use: No  . Sexual activity: Yes     Comment: lives with husband, yoga instructor, no dietary restrictions   Other Topics Concern  . Not on file   Social History Narrative   Married. Education: [employer]. 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 Ascorbic Acid (VITAMIN C PO) Take 1 tablet by mouth daily. Reported on 10/09/2015    . B Complex Vitamins (VITAMIN-B COMPLEX) TABS Take 1 tablet by mouth daily. Reported on 10/09/2015    . CALCIUM-MAGNESIUM PO Take by mouth daily. Reported on 10/09/2015    . Cholecalciferol (VITAMIN D PO) Take 1 tablet by mouth daily. Reported on 10/09/2015    . estradiol (ESTRACE) 0.1 MG/GM vaginal cream Place 2 g vaginally. Reported on 10/09/5282    . folic acid (FOLVITE) 1 MG tablet Take 1 mg by mouth daily.    . Probiotic Product (PROBIOTIC DAILY PO) Take by mouth.    Marland Kitchen  simvastatin (ZOCOR) 20 MG tablet Take 1 tablet (20 mg total) by mouth every evening. 90 tablet 0  . VITAMIN E PO Take 1 capsule by mouth daily. Reported on 10/09/2015    . b complex vitamins tablet Take 1 tablet by mouth daily. Reported on 10/09/2015     No facility-administered medications prior to visit.     Allergies  Allergen Reactions  . Codeine     Review of Systems  Constitutional: Negative for fever and malaise/fatigue.  HENT: Negative for congestion.   Eyes: Negative for blurred vision.  Respiratory: Negative for shortness of breath.   Cardiovascular: Negative for chest pain, palpitations and leg swelling.  Gastrointestinal: Negative for abdominal pain, blood in stool and nausea.  Genitourinary: Negative for dysuria and frequency.  Musculoskeletal: Positive for joint  pain. Negative for falls.  Skin: Negative for rash.  Neurological: Negative for dizziness, loss of consciousness and headaches.  Endo/Heme/Allergies: Negative for environmental allergies.  Psychiatric/Behavioral: Negative for depression. The patient is not nervous/anxious.        Objective:    Physical Exam  Constitutional: She is oriented to person, place, and time. She appears well-developed and well-nourished. No distress.  HENT:  Head: Normocephalic and atraumatic.  Eyes: Conjunctivae are normal.  Neck: Neck supple. No thyromegaly present.  Cardiovascular: Normal rate, regular rhythm and normal heart sounds.   No murmur heard. Pulmonary/Chest: Effort normal and breath sounds normal. No respiratory distress.  Abdominal: Soft. Bowel sounds are normal. She exhibits no distension and no mass. There is no tenderness.  Musculoskeletal: She exhibits no edema.  Lymphadenopathy:    She has no cervical adenopathy.  Neurological: She is alert and oriented to person, place, and time.  Skin: Skin is warm and dry.  Psychiatric: She has a normal mood and affect. Her behavior is normal.    BP 127/68 (BP Location: Left Arm, Patient Position: Sitting, Cuff Size: Normal)   Pulse 70   Ht 5\' 5"  (1.651 m)   Wt 126 lb 9.6 oz (57.4 kg)   SpO2 99%   BMI 21.07 kg/m  Wt Readings from Last 3 Encounters:  12/23/16 126 lb 9.6 oz (57.4 kg)  02/03/16 133 lb 8 oz (60.6 kg)  10/09/15 131 lb 6 oz (59.6 kg)     Lab Results  Component Value Date   WBC 5.4 12/23/2016   HGB 14.4 12/23/2016   HCT 42.3 12/23/2016   PLT 330.0 12/23/2016   GLUCOSE 91 12/23/2016   CHOL 197 12/23/2016   TRIG 79.0 12/23/2016   HDL 102.10 12/23/2016   LDLCALC 79 12/23/2016   ALT 24 12/23/2016   AST 25 12/23/2016   NA 132 (L) 12/23/2016   K 4.5 12/23/2016   CL 97 12/23/2016   CREATININE 0.71 12/23/2016   BUN 12 12/23/2016   CO2 30 12/23/2016   TSH 0.79 12/23/2016    Lab Results  Component Value Date   TSH 0.79  12/23/2016   Lab Results  Component Value Date   WBC 5.4 12/23/2016   HGB 14.4 12/23/2016   HCT 42.3 12/23/2016   MCV 98.9 12/23/2016   PLT 330.0 12/23/2016   Lab Results  Component Value Date   NA 132 (L) 12/23/2016   K 4.5 12/23/2016   CO2 30 12/23/2016   GLUCOSE 91 12/23/2016   BUN 12 12/23/2016   CREATININE 0.71 12/23/2016   BILITOT 0.5 12/23/2016   ALKPHOS 42 12/23/2016   AST 25 12/23/2016   ALT 24 12/23/2016   PROT 6.6  12/23/2016   ALBUMIN 4.3 12/23/2016   CALCIUM 9.8 12/23/2016   GFR 86.38 12/23/2016   Lab Results  Component Value Date   CHOL 197 12/23/2016   Lab Results  Component Value Date   HDL 102.10 12/23/2016   Lab Results  Component Value Date   LDLCALC 79 12/23/2016   Lab Results  Component Value Date   TRIG 79.0 12/23/2016   Lab Results  Component Value Date   CHOLHDL 2 12/23/2016   No results found for: HGBA1C     Assessment & Plan:   Problem List Items Addressed This Visit    Vitamin D deficiency    Check level today, takes Vitamin D 2000 IU daily      Relevant Orders   Comprehensive metabolic panel (Completed)   VITAMIN D 25 Hydroxy (Vit-D Deficiency, Fractures) (Completed)   Osteopenia    Encouraged to get adequate exercise, calcium and vitamin d intake      Hyperlipidemia, mixed    Encouraged heart healthy diet, increase exercise, avoid trans fats, consider a krill oil cap daily      Relevant Orders   Lipid panel (Completed)   Preventative health care    Patient encouraged to maintain heart healthy diet, regular exercise, adequate sleep. Consider daily probiotics. Take medications as prescribed. Declines pneumonia shots, tetanus, shingles, flu shots. Has HCP documents in chart      Relevant Orders   CBC (Completed)   TSH (Completed)   Hyponatremia    Check cmp      Relevant Orders   Comprehensive metabolic panel   Chronic jaw pain    Injury and arthritis noted in previous imaging her orthodontist is managing her  pain with splinting which are very helpful while they are in place but needs repeat imaging, ordered today      TMJ arthritis    Following closely with dentist and she is using some discs to hold the jaws in place. The discs help without them she cannot chew.      Relevant Orders   MR TMJ    Other Visit Diagnoses    Encounter for Medicare annual wellness exam    -  Primary   Temporomandibular joint disorder       Relevant Orders   MR TMJ   CT MAXILLOFACIAL WO CONTRAST   Arthralgia of temporomandibular joint, unspecified laterality       Relevant Orders   CT MAXILLOFACIAL WO CONTRAST   CBC (Completed)   Comprehensive metabolic panel      I have discontinued Ms. Titzer's b complex vitamins. I am also having her maintain her estradiol, VITAMIN E PO, Ascorbic Acid (VITAMIN C PO), Cholecalciferol (VITAMIN D PO), Vitamin-B Complex, CALCIUM-MAGNESIUM PO, folic acid, Probiotic Product (PROBIOTIC DAILY PO), and simvastatin.  No orders of the defined types were placed in this encounter.   Penni Homans, MD

## 2016-12-26 NOTE — Assessment & Plan Note (Signed)
Injury and arthritis noted in previous imaging her orthodontist is managing her pain with splinting which are very helpful while they are in place but needs repeat imaging, ordered today

## 2016-12-26 NOTE — Assessment & Plan Note (Signed)
Encouraged to get adequate exercise, calcium and vitamin d intake 

## 2017-01-11 ENCOUNTER — Other Ambulatory Visit: Payer: Self-pay | Admitting: Family Medicine

## 2017-01-11 DIAGNOSIS — E785 Hyperlipidemia, unspecified: Secondary | ICD-10-CM

## 2017-01-13 ENCOUNTER — Other Ambulatory Visit: Payer: PPO

## 2017-01-14 ENCOUNTER — Other Ambulatory Visit: Payer: PPO

## 2017-01-17 DIAGNOSIS — M25562 Pain in left knee: Secondary | ICD-10-CM | POA: Diagnosis not present

## 2017-01-17 DIAGNOSIS — M25561 Pain in right knee: Secondary | ICD-10-CM | POA: Diagnosis not present

## 2017-01-17 DIAGNOSIS — M170 Bilateral primary osteoarthritis of knee: Secondary | ICD-10-CM | POA: Diagnosis not present

## 2017-01-20 DIAGNOSIS — L84 Corns and callosities: Secondary | ICD-10-CM | POA: Diagnosis not present

## 2017-01-20 DIAGNOSIS — B070 Plantar wart: Secondary | ICD-10-CM | POA: Diagnosis not present

## 2017-02-16 ENCOUNTER — Telehealth: Payer: Self-pay

## 2017-02-16 NOTE — Telephone Encounter (Signed)
Patient will call back to reschedule & have lab appt on same day. Patient actually needs a tetanus vaccination. Can you please verify that she is due?

## 2017-02-16 NOTE — Telephone Encounter (Addendum)
Called patient advised since she is having Tdap she could keep appointment. Advised there was a future order for labs (CMP) in the system and she could have labs drawn then or visa versa.

## 2017-02-16 NOTE — Telephone Encounter (Signed)
Called patient left message regarding TB test could not be given on Thursdays. Offered patient today at for or to call in to reschedule.

## 2017-02-17 ENCOUNTER — Other Ambulatory Visit (INDEPENDENT_AMBULATORY_CARE_PROVIDER_SITE_OTHER): Payer: PPO

## 2017-02-17 ENCOUNTER — Ambulatory Visit (INDEPENDENT_AMBULATORY_CARE_PROVIDER_SITE_OTHER): Payer: PPO

## 2017-02-17 DIAGNOSIS — M26629 Arthralgia of temporomandibular joint, unspecified side: Secondary | ICD-10-CM

## 2017-02-17 DIAGNOSIS — Z23 Encounter for immunization: Secondary | ICD-10-CM

## 2017-02-17 DIAGNOSIS — E871 Hypo-osmolality and hyponatremia: Secondary | ICD-10-CM

## 2017-02-17 LAB — COMPREHENSIVE METABOLIC PANEL
ALK PHOS: 45 U/L (ref 39–117)
ALT: 23 U/L (ref 0–35)
AST: 25 U/L (ref 0–37)
Albumin: 4.2 g/dL (ref 3.5–5.2)
BUN: 7 mg/dL (ref 6–23)
CO2: 30 mEq/L (ref 19–32)
Calcium: 9.6 mg/dL (ref 8.4–10.5)
Chloride: 97 mEq/L (ref 96–112)
Creatinine, Ser: 0.75 mg/dL (ref 0.40–1.20)
GFR: 81.05 mL/min (ref >60.00)
GLUCOSE: 99 mg/dL (ref 70–99)
POTASSIUM: 4.1 meq/L (ref 3.5–5.1)
Sodium: 133 mEq/L — ABNORMAL LOW (ref 135–145)
TOTAL PROTEIN: 6.5 g/dL (ref 6.0–8.3)
Total Bilirubin: 0.5 mg/dL (ref 0.2–1.2)

## 2017-02-22 ENCOUNTER — Other Ambulatory Visit: Payer: PPO

## 2017-02-23 DIAGNOSIS — M170 Bilateral primary osteoarthritis of knee: Secondary | ICD-10-CM | POA: Diagnosis not present

## 2017-02-24 ENCOUNTER — Other Ambulatory Visit: Payer: PPO

## 2017-03-02 DIAGNOSIS — M170 Bilateral primary osteoarthritis of knee: Secondary | ICD-10-CM | POA: Diagnosis not present

## 2017-03-07 ENCOUNTER — Ambulatory Visit
Admission: RE | Admit: 2017-03-07 | Discharge: 2017-03-07 | Disposition: A | Discharge status: Home / Self Care | Payer: PPO | Source: Ambulatory Visit | Attending: Family Medicine | Admitting: Family Medicine

## 2017-03-07 DIAGNOSIS — M2669 Other specified disorders of temporomandibular joint: Secondary | ICD-10-CM | POA: Diagnosis not present

## 2017-03-07 DIAGNOSIS — M26609 Unspecified temporomandibular joint disorder, unspecified side: Secondary | ICD-10-CM

## 2017-03-07 DIAGNOSIS — M26649 Arthritis of unspecified temporomandibular joint: Secondary | ICD-10-CM

## 2017-03-07 DIAGNOSIS — M26629 Arthralgia of temporomandibular joint, unspecified side: Secondary | ICD-10-CM

## 2017-03-09 DIAGNOSIS — M170 Bilateral primary osteoarthritis of knee: Secondary | ICD-10-CM | POA: Diagnosis not present

## 2017-03-30 DIAGNOSIS — Z1231 Encounter for screening mammogram for malignant neoplasm of breast: Secondary | ICD-10-CM | POA: Diagnosis not present

## 2017-03-30 LAB — HM MAMMOGRAPHY

## 2017-04-14 DIAGNOSIS — M8589 Other specified disorders of bone density and structure, multiple sites: Secondary | ICD-10-CM | POA: Diagnosis not present

## 2017-04-21 ENCOUNTER — Encounter: Payer: Self-pay | Admitting: Family Medicine

## 2017-05-02 DIAGNOSIS — M170 Bilateral primary osteoarthritis of knee: Secondary | ICD-10-CM | POA: Diagnosis not present

## 2017-07-11 DIAGNOSIS — M1712 Unilateral primary osteoarthritis, left knee: Secondary | ICD-10-CM | POA: Diagnosis not present

## 2017-07-11 DIAGNOSIS — M1711 Unilateral primary osteoarthritis, right knee: Secondary | ICD-10-CM | POA: Diagnosis not present

## 2017-07-21 DIAGNOSIS — L570 Actinic keratosis: Secondary | ICD-10-CM | POA: Diagnosis not present

## 2017-10-10 DIAGNOSIS — L570 Actinic keratosis: Secondary | ICD-10-CM | POA: Diagnosis not present

## 2017-10-10 DIAGNOSIS — B079 Viral wart, unspecified: Secondary | ICD-10-CM | POA: Diagnosis not present

## 2017-10-20 DIAGNOSIS — Z01419 Encounter for gynecological examination (general) (routine) without abnormal findings: Secondary | ICD-10-CM | POA: Diagnosis not present

## 2017-10-24 ENCOUNTER — Encounter: Payer: Self-pay | Admitting: Family Medicine

## 2017-10-25 ENCOUNTER — Other Ambulatory Visit: Payer: Self-pay | Admitting: Family Medicine

## 2017-10-25 DIAGNOSIS — M79606 Pain in leg, unspecified: Secondary | ICD-10-CM

## 2017-10-28 DIAGNOSIS — S76311S Strain of muscle, fascia and tendon of the posterior muscle group at thigh level, right thigh, sequela: Secondary | ICD-10-CM | POA: Diagnosis not present

## 2017-10-31 DIAGNOSIS — S76311S Strain of muscle, fascia and tendon of the posterior muscle group at thigh level, right thigh, sequela: Secondary | ICD-10-CM | POA: Diagnosis not present

## 2017-11-03 DIAGNOSIS — S76311S Strain of muscle, fascia and tendon of the posterior muscle group at thigh level, right thigh, sequela: Secondary | ICD-10-CM | POA: Diagnosis not present

## 2017-11-07 DIAGNOSIS — S76311S Strain of muscle, fascia and tendon of the posterior muscle group at thigh level, right thigh, sequela: Secondary | ICD-10-CM | POA: Diagnosis not present

## 2017-11-10 DIAGNOSIS — S76311S Strain of muscle, fascia and tendon of the posterior muscle group at thigh level, right thigh, sequela: Secondary | ICD-10-CM | POA: Diagnosis not present

## 2017-11-14 DIAGNOSIS — S76311S Strain of muscle, fascia and tendon of the posterior muscle group at thigh level, right thigh, sequela: Secondary | ICD-10-CM | POA: Diagnosis not present

## 2017-11-28 DIAGNOSIS — S76311S Strain of muscle, fascia and tendon of the posterior muscle group at thigh level, right thigh, sequela: Secondary | ICD-10-CM | POA: Diagnosis not present

## 2017-12-02 DIAGNOSIS — S76311S Strain of muscle, fascia and tendon of the posterior muscle group at thigh level, right thigh, sequela: Secondary | ICD-10-CM | POA: Diagnosis not present

## 2017-12-26 ENCOUNTER — Ambulatory Visit: Payer: PPO | Admitting: *Deleted

## 2017-12-27 ENCOUNTER — Encounter: Payer: PPO | Admitting: Family Medicine

## 2017-12-27 DIAGNOSIS — I10 Essential (primary) hypertension: Secondary | ICD-10-CM | POA: Diagnosis not present

## 2017-12-27 DIAGNOSIS — M179 Osteoarthritis of knee, unspecified: Secondary | ICD-10-CM | POA: Diagnosis not present

## 2017-12-27 DIAGNOSIS — Z0001 Encounter for general adult medical examination with abnormal findings: Secondary | ICD-10-CM | POA: Diagnosis not present

## 2017-12-27 DIAGNOSIS — E871 Hypo-osmolality and hyponatremia: Secondary | ICD-10-CM | POA: Diagnosis not present

## 2017-12-27 DIAGNOSIS — E559 Vitamin D deficiency, unspecified: Secondary | ICD-10-CM | POA: Diagnosis not present

## 2017-12-27 DIAGNOSIS — E785 Hyperlipidemia, unspecified: Secondary | ICD-10-CM | POA: Diagnosis not present

## 2017-12-29 DIAGNOSIS — S76311S Strain of muscle, fascia and tendon of the posterior muscle group at thigh level, right thigh, sequela: Secondary | ICD-10-CM | POA: Diagnosis not present

## 2018-01-16 DIAGNOSIS — B079 Viral wart, unspecified: Secondary | ICD-10-CM | POA: Diagnosis not present

## 2018-01-16 DIAGNOSIS — C44711 Basal cell carcinoma of skin of unspecified lower limb, including hip: Secondary | ICD-10-CM | POA: Diagnosis not present

## 2018-01-17 DIAGNOSIS — Z9889 Other specified postprocedural states: Secondary | ICD-10-CM | POA: Diagnosis not present

## 2018-01-17 DIAGNOSIS — M1712 Unilateral primary osteoarthritis, left knee: Secondary | ICD-10-CM | POA: Diagnosis not present

## 2018-01-17 DIAGNOSIS — G8929 Other chronic pain: Secondary | ICD-10-CM | POA: Diagnosis not present

## 2018-01-17 DIAGNOSIS — M1711 Unilateral primary osteoarthritis, right knee: Secondary | ICD-10-CM | POA: Diagnosis not present

## 2018-01-17 DIAGNOSIS — M170 Bilateral primary osteoarthritis of knee: Secondary | ICD-10-CM | POA: Diagnosis not present

## 2018-01-26 DIAGNOSIS — R7301 Impaired fasting glucose: Secondary | ICD-10-CM | POA: Diagnosis not present

## 2018-01-26 DIAGNOSIS — M174 Other bilateral secondary osteoarthritis of knee: Secondary | ICD-10-CM | POA: Diagnosis not present

## 2018-01-26 DIAGNOSIS — Z01818 Encounter for other preprocedural examination: Secondary | ICD-10-CM | POA: Diagnosis not present

## 2018-01-26 DIAGNOSIS — Z09 Encounter for follow-up examination after completed treatment for conditions other than malignant neoplasm: Secondary | ICD-10-CM | POA: Diagnosis not present

## 2018-01-27 DIAGNOSIS — G8929 Other chronic pain: Secondary | ICD-10-CM | POA: Diagnosis not present

## 2018-01-27 DIAGNOSIS — M170 Bilateral primary osteoarthritis of knee: Secondary | ICD-10-CM | POA: Diagnosis not present

## 2018-02-01 DIAGNOSIS — S76311S Strain of muscle, fascia and tendon of the posterior muscle group at thigh level, right thigh, sequela: Secondary | ICD-10-CM | POA: Diagnosis not present

## 2018-02-07 DIAGNOSIS — S76311S Strain of muscle, fascia and tendon of the posterior muscle group at thigh level, right thigh, sequela: Secondary | ICD-10-CM | POA: Diagnosis not present

## 2018-02-13 DIAGNOSIS — C4491 Basal cell carcinoma of skin, unspecified: Secondary | ICD-10-CM | POA: Diagnosis not present

## 2018-02-13 DIAGNOSIS — B079 Viral wart, unspecified: Secondary | ICD-10-CM | POA: Diagnosis not present

## 2018-02-22 DIAGNOSIS — S76311S Strain of muscle, fascia and tendon of the posterior muscle group at thigh level, right thigh, sequela: Secondary | ICD-10-CM | POA: Diagnosis not present

## 2018-02-27 DIAGNOSIS — S76311S Strain of muscle, fascia and tendon of the posterior muscle group at thigh level, right thigh, sequela: Secondary | ICD-10-CM | POA: Diagnosis not present

## 2018-03-02 DIAGNOSIS — S76311S Strain of muscle, fascia and tendon of the posterior muscle group at thigh level, right thigh, sequela: Secondary | ICD-10-CM | POA: Diagnosis not present

## 2018-03-07 DIAGNOSIS — S76311S Strain of muscle, fascia and tendon of the posterior muscle group at thigh level, right thigh, sequela: Secondary | ICD-10-CM | POA: Diagnosis not present

## 2018-03-13 ENCOUNTER — Other Ambulatory Visit: Payer: Self-pay | Admitting: Orthopedic Surgery

## 2018-03-13 DIAGNOSIS — B079 Viral wart, unspecified: Secondary | ICD-10-CM | POA: Diagnosis not present

## 2018-03-14 DIAGNOSIS — R05 Cough: Secondary | ICD-10-CM | POA: Diagnosis not present

## 2018-03-14 DIAGNOSIS — B349 Viral infection, unspecified: Secondary | ICD-10-CM | POA: Diagnosis not present

## 2018-03-14 DIAGNOSIS — J45909 Unspecified asthma, uncomplicated: Secondary | ICD-10-CM | POA: Diagnosis not present

## 2018-03-15 DIAGNOSIS — S76311S Strain of muscle, fascia and tendon of the posterior muscle group at thigh level, right thigh, sequela: Secondary | ICD-10-CM | POA: Diagnosis not present

## 2018-03-17 DIAGNOSIS — M179 Osteoarthritis of knee, unspecified: Secondary | ICD-10-CM | POA: Diagnosis not present

## 2018-03-17 DIAGNOSIS — J45909 Unspecified asthma, uncomplicated: Secondary | ICD-10-CM | POA: Diagnosis not present

## 2018-03-17 DIAGNOSIS — B349 Viral infection, unspecified: Secondary | ICD-10-CM | POA: Diagnosis not present

## 2018-03-17 DIAGNOSIS — S76311S Strain of muscle, fascia and tendon of the posterior muscle group at thigh level, right thigh, sequela: Secondary | ICD-10-CM | POA: Diagnosis not present

## 2018-03-22 DIAGNOSIS — S76311S Strain of muscle, fascia and tendon of the posterior muscle group at thigh level, right thigh, sequela: Secondary | ICD-10-CM | POA: Diagnosis not present

## 2018-03-23 ENCOUNTER — Encounter (HOSPITAL_COMMUNITY): Payer: Self-pay

## 2018-03-23 DIAGNOSIS — J029 Acute pharyngitis, unspecified: Secondary | ICD-10-CM | POA: Diagnosis not present

## 2018-03-23 DIAGNOSIS — M179 Osteoarthritis of knee, unspecified: Secondary | ICD-10-CM | POA: Diagnosis not present

## 2018-03-23 DIAGNOSIS — J019 Acute sinusitis, unspecified: Secondary | ICD-10-CM | POA: Diagnosis not present

## 2018-03-23 DIAGNOSIS — J45909 Unspecified asthma, uncomplicated: Secondary | ICD-10-CM | POA: Diagnosis not present

## 2018-03-23 DIAGNOSIS — E785 Hyperlipidemia, unspecified: Secondary | ICD-10-CM | POA: Diagnosis not present

## 2018-03-23 NOTE — Patient Instructions (Addendum)
Your procedure is scheduled on: Monday, Dec. 23, 2019   Surgery Time:  7:30AM-8:45AM   Report to [facility]  Entrance    Report to admitting at 5:30 AM   Call this number if you have problems the morning of surgery [phone]   Do not eat food or drink liquids :After Midnight.   Brush your teeth the morning of surgery.   Do NOT smoke after Midnight   Take these medicines the morning of surgery with A SIP OF WATER: None   May use eye drops if needed day of surgery                               You may not have any metal on your body including hair pins, jewelry, and body piercings             Do not wear make-up, lotions, powders, perfumes/cologne, or deodorant             Do not wear nail polish.  Do not shave  48 hours prior to surgery.                Do not bring valuables to the hospital. [place].   Contacts, dentures or bridgework may not be worn into surgery.   Leave suitcase in the car. After surgery it may be brought to your room.    Special Instructions: Bring a copy of your healthcare power of attorney and living will documents         the day of surgery if you haven't scanned them in before.   Call Dr. Ruel Favors office to see if they will give your prescriptions early due to Holiday              Please read over the following fact sheets you were given:  [facility] - Preparing for Surgery Before surgery, you can play an important role.  Because skin is not sterile, your skin needs to be as free of germs as possible.  You can reduce the number of germs on your skin by washing with CHG (chlorahexidine gluconate) soap before surgery.  CHG is an antiseptic cleaner which kills germs and bonds with the skin to continue killing germs even after washing. Please DO NOT use if you have an allergy to CHG or antibacterial soaps.  If your skin becomes reddened/irritated stop using the CHG and inform your  nurse when you arrive at Short Stay. Do not shave (including legs and underarms) for at least 48 hours prior to the first CHG shower.  You may shave your face/neck.  Please follow these instructions carefully:  1.  Shower with CHG Soap the night before surgery and the  morning of surgery.  2.  If you choose to wash your hair, wash your hair first as usual with your normal  shampoo.  3.  After you shampoo, rinse your hair and body thoroughly to remove the shampoo.                             4.  Use CHG as you would any other liquid soap.  You can apply chg directly to the skin and wash.  Gently with a scrungie or clean washcloth.  5.  Apply  the CHG Soap to your body ONLY FROM THE NECK DOWN.   Do   not use on face/ open                           Wound or open sores. Avoid contact with eyes, ears mouth and   genitals (private parts).                       Wash face,  Genitals (private parts) with your normal soap.             6.  Wash thoroughly, paying special attention to the area where your    surgery  will be performed.  7.  Thoroughly rinse your body with warm water from the neck down.  8.  DO NOT shower/wash with your normal soap after using and rinsing off the CHG Soap.                9.  Pat yourself dry with a clean towel.            10.  Wear clean pajamas.            11.  Place clean sheets on your bed the night of your first shower and do not  sleep with pets. Day of Surgery : Do not apply any lotions/deodorants the morning of surgery.  Please wear clean clothes to the hospital/surgery center.  FAILURE TO FOLLOW THESE INSTRUCTIONS MAY RESULT IN THE CANCELLATION OF YOUR SURGERY  PATIENT SIGNATURE_________________________________  NURSE SIGNATURE__________________________________  ________________________________________________________________________   Adam Phenix  An incentive spirometer is a tool that can help keep your lungs clear and active. This tool measures  how well you are filling your lungs with each breath. Taking long deep breaths may help reverse or decrease the chance of developing breathing (pulmonary) problems (especially infection) following:  A long period of time when you are unable to move or be active. BEFORE THE PROCEDURE   If the spirometer includes an indicator to show your best effort, your nurse or respiratory therapist will set it to a desired goal.  If possible, sit up straight or lean slightly forward. Try not to slouch.  Hold the incentive spirometer in an upright position. INSTRUCTIONS FOR USE  1. Sit on the edge of your bed if possible, or sit up as far as you can in bed or on a chair. 2. Hold the incentive spirometer in an upright position. 3. Breathe out normally. 4. Place the mouthpiece in your mouth and seal your lips tightly around it. 5. Breathe in slowly and as deeply as possible, raising the piston or the ball toward the top of the column. 6. Hold your breath for 3-5 seconds or for as long as possible. Allow the piston or ball to fall to the bottom of the column. 7. Remove the mouthpiece from your mouth and breathe out normally. 8. Rest for a few seconds and repeat Steps 1 through 7 at least 10 times every 1-2 hours when you are awake. Take your time and take a few normal breaths between deep breaths. 9. The spirometer may include an indicator to show your best effort. Use the indicator as a goal to work toward during each repetition. 10. After each set of 10 deep breaths, practice coughing to be sure your lungs are clear. If you have an incision (the cut made at the time of surgery), support your incision when  coughing by placing a pillow or rolled up towels firmly against it. Once you are able to get out of bed, walk around indoors and cough well. You may stop using the incentive spirometer when instructed by your caregiver.  RISKS AND COMPLICATIONS  Take your time so you do not get dizzy or light-headed.  If  you are in pain, you may need to take or ask for pain medication before doing incentive spirometry. It is harder to take a deep breath if you are having pain. AFTER USE  Rest and breathe slowly and easily.  It can be helpful to keep track of a log of your progress. Your caregiver can provide you with a simple table to help with this. If you are using the spirometer at home, follow these instructions: [place] IF:   You are having difficultly using the spirometer.  You have trouble using the spirometer as often as instructed.  Your pain medication is not giving enough relief while using the spirometer.  You develop fever of 100.5 F (38.1 C) or higher. SEEK IMMEDIATE MEDICAL CARE IF:   You cough up bloody sputum that had not been present before.  You develop fever of 102 F (38.9 C) or greater.  You develop worsening pain at or near the incision site. MAKE SURE YOU:   Understand these instructions.  Will watch your condition.  Will get help right away if you are not doing well or get worse. Document Released: 08/09/2006 Document Revised: 06/21/2011 Document Reviewed: 10/10/2006 [facility] Patient Information 2014 [place], [place].   ________________________________________________________________________

## 2018-03-24 DIAGNOSIS — S76311S Strain of muscle, fascia and tendon of the posterior muscle group at thigh level, right thigh, sequela: Secondary | ICD-10-CM | POA: Diagnosis not present

## 2018-03-28 ENCOUNTER — Other Ambulatory Visit: Payer: Self-pay

## 2018-03-28 ENCOUNTER — Encounter (HOSPITAL_COMMUNITY): Payer: Self-pay

## 2018-03-28 ENCOUNTER — Encounter (HOSPITAL_COMMUNITY)
Admission: RE | Admit: 2018-03-28 | Discharge: 2018-03-28 | Disposition: A | Discharge status: Home / Self Care | Payer: PPO | Source: Ambulatory Visit | Attending: Orthopedic Surgery | Admitting: Orthopedic Surgery

## 2018-03-28 DIAGNOSIS — Z01812 Encounter for preprocedural laboratory examination: Secondary | ICD-10-CM | POA: Diagnosis not present

## 2018-03-28 HISTORY — DX: Other postherpetic nervous system involvement: B02.29

## 2018-03-28 HISTORY — DX: Postmenopausal atrophic vaginitis: N95.2

## 2018-03-28 HISTORY — DX: Personal history of other infectious and parasitic diseases: Z86.19

## 2018-03-28 HISTORY — DX: Unspecified fracture of left toe(s), initial encounter for closed fracture: S92.912A

## 2018-03-28 HISTORY — DX: Unspecified osteoarthritis, unspecified site: M19.90

## 2018-03-28 HISTORY — DX: Hyperlipidemia, unspecified: E78.5

## 2018-03-28 HISTORY — DX: Diverticulosis of intestine, part unspecified, without perforation or abscess without bleeding: K57.90

## 2018-03-28 HISTORY — DX: Vitamin D deficiency, unspecified: E55.9

## 2018-03-28 LAB — COMPREHENSIVE METABOLIC PANEL
ALK PHOS: 43 U/L (ref 38–126)
ALT: 34 U/L (ref 0–44)
AST: 34 U/L (ref 15–41)
Albumin: 4 g/dL (ref 3.5–5.0)
Anion gap: 8 (ref 5–15)
BUN: 10 mg/dL (ref 8–23)
CALCIUM: 9.1 mg/dL (ref 8.9–10.3)
CO2: 27 mmol/L (ref 22–32)
Chloride: 96 mmol/L — ABNORMAL LOW (ref 98–111)
Creatinine, Ser: 0.56 mg/dL (ref 0.44–1.00)
Glucose, Bld: 96 mg/dL (ref 70–99)
Potassium: 4.1 mmol/L (ref 3.5–5.1)
SODIUM: 131 mmol/L — AB (ref 135–145)
Total Bilirubin: 0.7 mg/dL (ref 0.3–1.2)
Total Protein: 6 g/dL — ABNORMAL LOW (ref 6.5–8.1)

## 2018-03-28 LAB — CBC WITH DIFFERENTIAL/PLATELET
Abs Immature Granulocytes: 0.02 10*3/uL (ref 0.00–0.07)
BASOS PCT: 1 %
Basophils Absolute: 0 10*3/uL (ref 0.0–0.1)
EOS PCT: 3 %
Eosinophils Absolute: 0.2 10*3/uL (ref 0.0–0.5)
HCT: 39.9 % (ref 36.0–46.0)
HEMOGLOBIN: 13.1 g/dL (ref 12.0–15.0)
Immature Granulocytes: 0 %
LYMPHS PCT: 20 %
Lymphs Abs: 1 10*3/uL (ref 0.7–4.0)
MCH: 31.8 pg (ref 26.0–34.0)
MCHC: 32.8 g/dL (ref 30.0–36.0)
MCV: 96.8 fL (ref 80.0–100.0)
MONO ABS: 0.4 10*3/uL (ref 0.1–1.0)
Monocytes Relative: 8 %
Neutro Abs: 3.5 10*3/uL (ref 1.7–7.7)
Neutrophils Relative %: 68 %
Platelets: 327 10*3/uL (ref 150–400)
RBC: 4.12 MIL/uL (ref 3.87–5.11)
RDW: 12.7 % (ref 11.5–15.5)
WBC: 5.2 10*3/uL (ref 4.0–10.5)
nRBC: 0 % (ref 0.0–0.2)

## 2018-03-28 LAB — SURGICAL PCR SCREEN
MRSA, PCR: NEGATIVE
Staphylococcus aureus: NEGATIVE

## 2018-03-28 NOTE — Pre-Procedure Instructions (Signed)
CMP results sent to Dr. Ronnie Derby via epic.

## 2018-03-31 DIAGNOSIS — S76311S Strain of muscle, fascia and tendon of the posterior muscle group at thigh level, right thigh, sequela: Secondary | ICD-10-CM | POA: Diagnosis not present

## 2018-04-02 MED ORDER — BUPIVACAINE LIPOSOME 1.3 % IJ SUSP
20.0000 mL | INTRAMUSCULAR | Status: DC
  Filled 2018-04-02: qty 20

## 2018-04-02 NOTE — Anesthesia Preprocedure Evaluation (Addendum)
Anesthesia Evaluation  Patient identified by MRN, date of birth, ID band Patient awake    Reviewed: Allergy & Precautions, NPO status , Patient's Chart, lab work & pertinent test results  History of Anesthesia Complications (+) PROLONGED EMERGENCE  Airway Mallampati: I  TM Distance: >3 FB Neck ROM: Full    Dental no notable dental hx. (+) Teeth Intact, Dental Advisory Given   Pulmonary neg pulmonary ROS,    Pulmonary exam normal breath sounds clear to auscultation       Cardiovascular negative cardio ROS Normal cardiovascular exam Rhythm:Regular Rate:Normal  HLD   Neuro/Psych negative neurological ROS  negative psych ROS   GI/Hepatic negative GI ROS, Neg liver ROS,   Endo/Other  negative endocrine ROS  Renal/GU negative Renal ROS  negative genitourinary   Musculoskeletal  (+) Arthritis , Osteoarthritis,    Abdominal   Peds  Hematology negative hematology ROS (+)   Anesthesia Other Findings   Reproductive/Obstetrics                           Anesthesia Physical Anesthesia Plan  ASA: II  Anesthesia Plan: Spinal and Regional   Post-op Pain Management:  Regional for Post-op pain   Induction:   PONV Risk Score and Plan: 2 and Treatment may vary due to age or medical condition and Propofol infusion  Airway Management Planned: Natural Airway  Additional Equipment:   Intra-op Plan:   Post-operative Plan:   Informed Consent: I have reviewed the patients History and Physical, chart, labs and discussed the procedure including the risks, benefits and alternatives for the proposed anesthesia with the patient or authorized representative who has indicated his/her understanding and acceptance.   Dental advisory given  Plan Discussed with: CRNA  Anesthesia Plan Comments:         Anesthesia Quick Evaluation

## 2018-04-03 ENCOUNTER — Ambulatory Visit (HOSPITAL_COMMUNITY): Payer: PPO | Admitting: Anesthesiology

## 2018-04-03 ENCOUNTER — Encounter (HOSPITAL_COMMUNITY)
Admission: RE | Disposition: A | Discharge status: Home / Self Care | Payer: Self-pay | Source: Other Acute Inpatient Hospital | Attending: Orthopedic Surgery

## 2018-04-03 ENCOUNTER — Other Ambulatory Visit: Payer: Self-pay

## 2018-04-03 ENCOUNTER — Encounter (HOSPITAL_COMMUNITY): Payer: Self-pay | Admitting: Emergency Medicine

## 2018-04-03 ENCOUNTER — Observation Stay (HOSPITAL_COMMUNITY)
Admission: RE | Admit: 2018-04-03 | Discharge: 2018-04-04 | Disposition: A | Discharge status: Home / Self Care | Payer: PPO | Source: Other Acute Inpatient Hospital | Attending: Orthopedic Surgery | Admitting: Orthopedic Surgery

## 2018-04-03 DIAGNOSIS — E559 Vitamin D deficiency, unspecified: Secondary | ICD-10-CM | POA: Diagnosis not present

## 2018-04-03 DIAGNOSIS — G8918 Other acute postprocedural pain: Secondary | ICD-10-CM | POA: Diagnosis not present

## 2018-04-03 DIAGNOSIS — Z818 Family history of other mental and behavioral disorders: Secondary | ICD-10-CM | POA: Diagnosis not present

## 2018-04-03 DIAGNOSIS — Z8262 Family history of osteoporosis: Secondary | ICD-10-CM | POA: Diagnosis not present

## 2018-04-03 DIAGNOSIS — G8929 Other chronic pain: Secondary | ICD-10-CM | POA: Diagnosis not present

## 2018-04-03 DIAGNOSIS — Z820 Family history of epilepsy and other diseases of the nervous system: Secondary | ICD-10-CM | POA: Diagnosis not present

## 2018-04-03 DIAGNOSIS — R6884 Jaw pain: Secondary | ICD-10-CM | POA: Insufficient documentation

## 2018-04-03 DIAGNOSIS — E782 Mixed hyperlipidemia: Secondary | ICD-10-CM | POA: Diagnosis not present

## 2018-04-03 DIAGNOSIS — Z833 Family history of diabetes mellitus: Secondary | ICD-10-CM | POA: Diagnosis not present

## 2018-04-03 DIAGNOSIS — M1712 Unilateral primary osteoarthritis, left knee: Principal | ICD-10-CM | POA: Insufficient documentation

## 2018-04-03 DIAGNOSIS — Z7982 Long term (current) use of aspirin: Secondary | ICD-10-CM | POA: Diagnosis not present

## 2018-04-03 DIAGNOSIS — Z823 Family history of stroke: Secondary | ICD-10-CM | POA: Insufficient documentation

## 2018-04-03 DIAGNOSIS — Z96659 Presence of unspecified artificial knee joint: Secondary | ICD-10-CM

## 2018-04-03 DIAGNOSIS — M8580 Other specified disorders of bone density and structure, unspecified site: Secondary | ICD-10-CM | POA: Diagnosis not present

## 2018-04-03 DIAGNOSIS — Z79899 Other long term (current) drug therapy: Secondary | ICD-10-CM | POA: Diagnosis not present

## 2018-04-03 DIAGNOSIS — Z8249 Family history of ischemic heart disease and other diseases of the circulatory system: Secondary | ICD-10-CM | POA: Diagnosis not present

## 2018-04-03 HISTORY — PX: TOTAL KNEE ARTHROPLASTY: SHX125

## 2018-04-03 SURGERY — ARTHROPLASTY, KNEE, TOTAL
Anesthesia: Regional | Site: Knee | Laterality: Left

## 2018-04-03 MED ORDER — BUPIVACAINE IN DEXTROSE 0.75-8.25 % IT SOLN
INTRATHECAL | Status: DC | PRN
  Administered 2018-04-03: 1.6 mL via INTRATHECAL

## 2018-04-03 MED ORDER — MIDAZOLAM HCL 2 MG/2ML IJ SOLN
INTRAMUSCULAR | Status: AC
  Filled 2018-04-03: qty 2

## 2018-04-03 MED ORDER — EPHEDRINE SULFATE-NACL 50-0.9 MG/10ML-% IV SOSY
PREFILLED_SYRINGE | INTRAVENOUS | Status: DC | PRN
  Administered 2018-04-03: 10 mg via INTRAVENOUS
  Administered 2018-04-03: 5 mg via INTRAVENOUS

## 2018-04-03 MED ORDER — DEXAMETHASONE SODIUM PHOSPHATE 10 MG/ML IJ SOLN: 8.0000 mg | Freq: Once | INTRAMUSCULAR | Status: DC

## 2018-04-03 MED ORDER — BUPIVACAINE-EPINEPHRINE (PF) 0.5% -1:200000 IJ SOLN
INTRAMUSCULAR | Status: DC | PRN
  Administered 2018-04-03: 20 mL via PERINEURAL

## 2018-04-03 MED ORDER — GABAPENTIN 300 MG PO CAPS
300.0000 mg | ORAL_CAPSULE | Freq: Three times a day (TID) | ORAL | Status: DC
  Administered 2018-04-03 – 2018-04-04 (×3): 300 mg via ORAL
  Filled 2018-04-03 (×3): qty 1

## 2018-04-03 MED ORDER — GABAPENTIN 300 MG PO CAPS
ORAL_CAPSULE | ORAL | Status: AC
  Administered 2018-04-03: 300 mg via ORAL
  Filled 2018-04-03: qty 1

## 2018-04-03 MED ORDER — FERROUS SULFATE 325 (65 FE) MG PO TABS
325.0000 mg | ORAL_TABLET | Freq: Three times a day (TID) | ORAL | Status: DC
  Administered 2018-04-03 – 2018-04-04 (×4): 325 mg via ORAL
  Filled 2018-04-03 (×4): qty 1

## 2018-04-03 MED ORDER — ACETAMINOPHEN 500 MG PO TABS
1000.0000 mg | ORAL_TABLET | Freq: Once | ORAL | Status: AC
  Administered 2018-04-03: 1000 mg via ORAL

## 2018-04-03 MED ORDER — TRAMADOL HCL 50 MG PO TABS
50.0000 mg | ORAL_TABLET | Freq: Four times a day (QID) | ORAL | Status: DC
  Administered 2018-04-03 – 2018-04-04 (×5): 50 mg via ORAL
  Filled 2018-04-03 (×6): qty 1

## 2018-04-03 MED ORDER — FLEET ENEMA 7-19 GM/118ML RE ENEM: 1.0000 | ENEMA | Freq: Once | RECTAL | Status: DC | PRN

## 2018-04-03 MED ORDER — ONDANSETRON HCL 4 MG/2ML IJ SOLN
INTRAMUSCULAR | Status: DC | PRN
  Administered 2018-04-03: 4 mg via INTRAVENOUS

## 2018-04-03 MED ORDER — METOCLOPRAMIDE HCL 5 MG PO TABS: 5.0000 mg | ORAL_TABLET | Freq: Three times a day (TID) | ORAL | Status: DC | PRN

## 2018-04-03 MED ORDER — ACETAMINOPHEN 500 MG PO TABS
1000.0000 mg | ORAL_TABLET | Freq: Four times a day (QID) | ORAL | Status: AC
  Administered 2018-04-03 (×3): 1000 mg via ORAL
  Filled 2018-04-03 (×4): qty 2

## 2018-04-03 MED ORDER — ALUM & MAG HYDROXIDE-SIMETH 200-200-20 MG/5ML PO SUSP: 30.0000 mL | ORAL | Status: DC | PRN

## 2018-04-03 MED ORDER — METOCLOPRAMIDE HCL 5 MG/ML IJ SOLN: 5.0000 mg | Freq: Three times a day (TID) | INTRAMUSCULAR | Status: DC | PRN

## 2018-04-03 MED ORDER — MENTHOL 3 MG MT LOZG
1.0000 | LOZENGE | OROMUCOSAL | Status: DC | PRN
  Filled 2018-04-03: qty 9

## 2018-04-03 MED ORDER — SODIUM CHLORIDE 0.9 % IV SOLN
INTRAVENOUS | Status: DC
  Administered 2018-04-03 – 2018-04-04 (×2): via INTRAVENOUS

## 2018-04-03 MED ORDER — METHOCARBAMOL 500 MG PO TABS
500.0000 mg | ORAL_TABLET | Freq: Four times a day (QID) | ORAL | Status: DC | PRN
  Administered 2018-04-03 – 2018-04-04 (×4): 500 mg via ORAL
  Filled 2018-04-03 (×4): qty 1

## 2018-04-03 MED ORDER — CHLORHEXIDINE GLUCONATE 4 % EX LIQD: 60.0000 mL | Freq: Once | CUTANEOUS | Status: DC

## 2018-04-03 MED ORDER — FENTANYL CITRATE (PF) 100 MCG/2ML IJ SOLN
INTRAMUSCULAR | Status: DC | PRN
  Administered 2018-04-03 (×2): 50 ug via INTRAVENOUS

## 2018-04-03 MED ORDER — DEXAMETHASONE SODIUM PHOSPHATE 4 MG/ML IJ SOLN
INTRAMUSCULAR | Status: DC | PRN
  Administered 2018-04-03: 10 mg via INTRAVENOUS

## 2018-04-03 MED ORDER — BUPIVACAINE-EPINEPHRINE 0.5% -1:200000 IJ SOLN
INTRAMUSCULAR | Status: DC | PRN
  Administered 2018-04-03: 20 mL

## 2018-04-03 MED ORDER — MOMETASONE FURO-FORMOTEROL FUM 200-5 MCG/ACT IN AERO
2.0000 | INHALATION_SPRAY | Freq: Two times a day (BID) | RESPIRATORY_TRACT | Status: DC
  Filled 2018-04-03: qty 8.8

## 2018-04-03 MED ORDER — PROPOFOL 10 MG/ML IV BOLUS
INTRAVENOUS | Status: AC
  Filled 2018-04-03: qty 60

## 2018-04-03 MED ORDER — DIPHENHYDRAMINE HCL 12.5 MG/5ML PO ELIX: 12.5000 mg | ORAL_SOLUTION | ORAL | Status: DC | PRN

## 2018-04-03 MED ORDER — HYDROMORPHONE HCL 1 MG/ML IJ SOLN: 0.5000 mg | INTRAMUSCULAR | Status: DC | PRN

## 2018-04-03 MED ORDER — CEFAZOLIN SODIUM-DEXTROSE 2-4 GM/100ML-% IV SOLN
2.0000 g | INTRAVENOUS | Status: AC
  Administered 2018-04-03: 2 g via INTRAVENOUS
  Filled 2018-04-03: qty 100

## 2018-04-03 MED ORDER — ZOLPIDEM TARTRATE 5 MG PO TABS: 5.0000 mg | ORAL_TABLET | Freq: Every evening | ORAL | Status: DC | PRN

## 2018-04-03 MED ORDER — STERILE WATER FOR IRRIGATION IR SOLN
Status: DC | PRN
  Administered 2018-04-03: 2000 mL

## 2018-04-03 MED ORDER — ONDANSETRON HCL 4 MG/2ML IJ SOLN: 4.0000 mg | Freq: Four times a day (QID) | INTRAMUSCULAR | Status: DC | PRN

## 2018-04-03 MED ORDER — FENTANYL CITRATE (PF) 100 MCG/2ML IJ SOLN
INTRAMUSCULAR | Status: AC
  Filled 2018-04-03: qty 2

## 2018-04-03 MED ORDER — OXYCODONE HCL 5 MG PO TABS
5.0000 mg | ORAL_TABLET | ORAL | Status: DC | PRN
  Administered 2018-04-03 – 2018-04-04 (×2): 10 mg via ORAL
  Filled 2018-04-03 (×2): qty 2

## 2018-04-03 MED ORDER — DEXAMETHASONE SODIUM PHOSPHATE 10 MG/ML IJ SOLN
10.0000 mg | Freq: Once | INTRAMUSCULAR | Status: AC
  Administered 2018-04-04: 10 mg via INTRAVENOUS
  Filled 2018-04-03: qty 1

## 2018-04-03 MED ORDER — GABAPENTIN 300 MG PO CAPS
300.0000 mg | ORAL_CAPSULE | Freq: Once | ORAL | Status: AC
  Administered 2018-04-03: 300 mg via ORAL

## 2018-04-03 MED ORDER — ACETAMINOPHEN 500 MG PO TABS
ORAL_TABLET | ORAL | Status: AC
  Administered 2018-04-03: 1000 mg via ORAL
  Filled 2018-04-03: qty 2

## 2018-04-03 MED ORDER — SODIUM CHLORIDE 0.9 % IR SOLN
Status: DC | PRN
  Administered 2018-04-03: 1000 mL

## 2018-04-03 MED ORDER — PROPOFOL 500 MG/50ML IV EMUL
INTRAVENOUS | Status: DC | PRN
  Administered 2018-04-03: 150 ug/kg/min via INTRAVENOUS

## 2018-04-03 MED ORDER — FENTANYL CITRATE (PF) 100 MCG/2ML IJ SOLN: 25.0000 ug | INTRAMUSCULAR | Status: DC | PRN

## 2018-04-03 MED ORDER — TRANEXAMIC ACID-NACL 1000-0.7 MG/100ML-% IV SOLN
1000.0000 mg | Freq: Once | INTRAVENOUS | Status: AC
  Administered 2018-04-03: 1000 mg via INTRAVENOUS
  Filled 2018-04-03: qty 100

## 2018-04-03 MED ORDER — ASPIRIN EC 325 MG PO TBEC
325.0000 mg | DELAYED_RELEASE_TABLET | Freq: Two times a day (BID) | ORAL | Status: DC
  Administered 2018-04-04: 325 mg via ORAL
  Filled 2018-04-03: qty 1

## 2018-04-03 MED ORDER — BUPIVACAINE-EPINEPHRINE (PF) 0.25% -1:200000 IJ SOLN
INTRAMUSCULAR | Status: AC
  Filled 2018-04-03: qty 30

## 2018-04-03 MED ORDER — SENNOSIDES-DOCUSATE SODIUM 8.6-50 MG PO TABS: 1.0000 | ORAL_TABLET | Freq: Every evening | ORAL | Status: DC | PRN

## 2018-04-03 MED ORDER — MIDAZOLAM HCL 5 MG/5ML IJ SOLN
INTRAMUSCULAR | Status: DC | PRN
  Administered 2018-04-03 (×2): 1 mg via INTRAVENOUS

## 2018-04-03 MED ORDER — PANTOPRAZOLE SODIUM 40 MG PO TBEC
40.0000 mg | DELAYED_RELEASE_TABLET | Freq: Every day | ORAL | Status: DC
  Administered 2018-04-03 – 2018-04-04 (×2): 40 mg via ORAL
  Filled 2018-04-03 (×2): qty 1

## 2018-04-03 MED ORDER — CEFAZOLIN SODIUM-DEXTROSE 2-4 GM/100ML-% IV SOLN
2.0000 g | Freq: Four times a day (QID) | INTRAVENOUS | Status: AC
  Administered 2018-04-03 (×2): 2 g via INTRAVENOUS
  Filled 2018-04-03 (×2): qty 100

## 2018-04-03 MED ORDER — ONDANSETRON HCL 4 MG PO TABS
4.0000 mg | ORAL_TABLET | Freq: Four times a day (QID) | ORAL | Status: DC | PRN
  Administered 2018-04-04: 4 mg via ORAL
  Filled 2018-04-03: qty 1

## 2018-04-03 MED ORDER — BISACODYL 5 MG PO TBEC: 5.0000 mg | DELAYED_RELEASE_TABLET | Freq: Every day | ORAL | Status: DC | PRN

## 2018-04-03 MED ORDER — DOCUSATE SODIUM 100 MG PO CAPS
100.0000 mg | ORAL_CAPSULE | Freq: Two times a day (BID) | ORAL | Status: DC
  Administered 2018-04-03 – 2018-04-04 (×2): 100 mg via ORAL
  Filled 2018-04-03 (×2): qty 1

## 2018-04-03 MED ORDER — METHOCARBAMOL 500 MG IVPB - SIMPLE MED
500.0000 mg | Freq: Four times a day (QID) | INTRAVENOUS | Status: DC | PRN
  Filled 2018-04-03: qty 50

## 2018-04-03 MED ORDER — BUPIVACAINE LIPOSOME 1.3 % IJ SUSP
INTRAMUSCULAR | Status: DC | PRN
  Administered 2018-04-03: 20 mL

## 2018-04-03 MED ORDER — PHENYLEPHRINE HCL 10 MG/ML IJ SOLN
INTRAMUSCULAR | Status: DC | PRN
  Administered 2018-04-03: 80 ug via INTRAVENOUS
  Administered 2018-04-03 (×9): 40 ug via INTRAVENOUS
  Administered 2018-04-03: 80 ug via INTRAVENOUS
  Administered 2018-04-03: 40 ug via INTRAVENOUS

## 2018-04-03 MED ORDER — LACTATED RINGERS IV SOLN
INTRAVENOUS | Status: DC
  Administered 2018-04-03 (×2): via INTRAVENOUS

## 2018-04-03 MED ORDER — PHENOL 1.4 % MT LIQD
1.0000 | OROMUCOSAL | Status: DC | PRN
  Filled 2018-04-03: qty 177

## 2018-04-03 MED ORDER — SIMVASTATIN 20 MG PO TABS
20.0000 mg | ORAL_TABLET | Freq: Every day | ORAL | Status: DC
  Administered 2018-04-03: 20 mg via ORAL
  Filled 2018-04-03: qty 1

## 2018-04-03 MED ORDER — SODIUM CHLORIDE (PF) 0.9 % IJ SOLN
INTRAMUSCULAR | Status: AC
  Filled 2018-04-03: qty 20

## 2018-04-03 MED ORDER — TRANEXAMIC ACID-NACL 1000-0.7 MG/100ML-% IV SOLN
1000.0000 mg | INTRAVENOUS | Status: AC
  Administered 2018-04-03: 1000 mg via INTRAVENOUS
  Filled 2018-04-03: qty 100

## 2018-04-03 SURGICAL SUPPLY — 59 items
ARTISURF 12M PLY L 6-9CD KNEE (Knees) ×2 IMPLANT
BAG SPEC THK2 15X12 ZIP CLS (MISCELLANEOUS) ×1
BAG ZIPLOCK 12X15 (MISCELLANEOUS) ×3 IMPLANT
BLADE SAGITTAL 13X1.27X60 (BLADE) ×2 IMPLANT
BLADE SAGITTAL 13X1.27X60MM (BLADE) ×1
BLADE SAW SGTL 83.5X18.5 (BLADE) ×3 IMPLANT
BLADE SURG 15 STRL LF DISP TIS (BLADE) ×1 IMPLANT
BLADE SURG 15 STRL SS (BLADE) ×3
BLADE SURG SZ10 CARB STEEL (BLADE) ×6 IMPLANT
BNDG CMPR MED 10X6 ELC LF (GAUZE/BANDAGES/DRESSINGS) ×1
BNDG ELASTIC 6X10 VLCR STRL LF (GAUZE/BANDAGES/DRESSINGS) ×2 IMPLANT
BOWL SMART MIX CTS (DISPOSABLE) ×3 IMPLANT
BSPLAT TIB 5D D CMNT STM LT (Knees) ×1 IMPLANT
CEMENT BONE SIMPLEX SPEEDSET (Cement) ×6 IMPLANT
CLOSURE STERI-STRIP 1/2X4 (GAUZE/BANDAGES/DRESSINGS) ×1
CLOSURE WOUND 1/2 X4 (GAUZE/BANDAGES/DRESSINGS) ×2
CLSR STERI-STRIP ANTIMIC 1/2X4 (GAUZE/BANDAGES/DRESSINGS) ×1 IMPLANT
COVER SURGICAL LIGHT HANDLE (MISCELLANEOUS) ×3 IMPLANT
CUFF TOURN SGL QUICK 34 (TOURNIQUET CUFF) ×3
CUFF TRNQT CYL 34X4X40X1 (TOURNIQUET CUFF) ×1 IMPLANT
DECANTER SPIKE VIAL GLASS SM (MISCELLANEOUS) ×6 IMPLANT
DRAPE INCISE IOBAN 66X45 STRL (DRAPES) ×6 IMPLANT
DRAPE U-SHAPE 47X51 STRL (DRAPES) ×3 IMPLANT
DRSG AQUACEL AG ADV 3.5X10 (GAUZE/BANDAGES/DRESSINGS) ×3 IMPLANT
DURAPREP 26ML APPLICATOR (WOUND CARE) ×6 IMPLANT
ELECT REM PT RETURN 15FT ADLT (MISCELLANEOUS) ×3 IMPLANT
FEMUR  CMT CCR STD SZ6 L KNEE (Knees) ×2 IMPLANT
FEMUR CMT CCR STD SZ6 L KNEE (Knees) ×1 IMPLANT
FEMUR CMTD CCR STD SZ6 L KNEE (Knees) IMPLANT
GLOVE BIOGEL PI IND STRL 7.5 (GLOVE) ×1 IMPLANT
GLOVE BIOGEL PI IND STRL 8.5 (GLOVE) ×2 IMPLANT
GLOVE BIOGEL PI INDICATOR 7.5 (GLOVE) ×4
GLOVE BIOGEL PI INDICATOR 8.5 (GLOVE) ×4
GLOVE SURG ORTHO 8.0 STRL STRW (GLOVE) ×9 IMPLANT
GOWN STRL REUS W/ TWL XL LVL3 (GOWN DISPOSABLE) ×2 IMPLANT
GOWN STRL REUS W/TWL XL LVL3 (GOWN DISPOSABLE) ×9
HANDPIECE INTERPULSE COAX TIP (DISPOSABLE) ×3
HOLDER FOLEY CATH W/STRAP (MISCELLANEOUS) ×3 IMPLANT
HOOD PEEL AWAY FLYTE STAYCOOL (MISCELLANEOUS) ×9 IMPLANT
MANIFOLD NEPTUNE II (INSTRUMENTS) ×3 IMPLANT
NS IRRIG 1000ML POUR BTL (IV SOLUTION) ×3 IMPLANT
PACK TOTAL KNEE CUSTOM (KITS) ×3 IMPLANT
PROTECTOR NERVE ULNAR (MISCELLANEOUS) ×3 IMPLANT
SET HNDPC FAN SPRY TIP SCT (DISPOSABLE) ×1 IMPLANT
STEM POLY PAT PLY 32M KNEE (Knees) ×2 IMPLANT
STEM TIBIA 5 DEG SZ D L KNEE (Knees) IMPLANT
STRIP CLOSURE SKIN 1/2X4 (GAUZE/BANDAGES/DRESSINGS) ×3 IMPLANT
SUT BONE WAX W31G (SUTURE) ×3 IMPLANT
SUT MNCRL AB 3-0 PS2 18 (SUTURE) ×3 IMPLANT
SUT STRATAFIX 0 PDS 27 VIOLET (SUTURE) ×3
SUT STRATAFIX PDS+ 0 24IN (SUTURE) ×3 IMPLANT
SUT VIC AB 1 CT1 36 (SUTURE) ×3 IMPLANT
SUTURE STRATFX 0 PDS 27 VIOLET (SUTURE) ×1 IMPLANT
SYR CONTROL 10ML LL (SYRINGE) ×6 IMPLANT
TIBIA STEM 5 DEG SZ D L KNEE (Knees) ×3 IMPLANT
TRAY FOLEY CATH 14FR (SET/KITS/TRAYS/PACK) ×2 IMPLANT
WATER STERILE IRR 1000ML POUR (IV SOLUTION) ×6 IMPLANT
WRAP KNEE MAXI GEL POST OP (GAUZE/BANDAGES/DRESSINGS) ×3 IMPLANT
YANKAUER SUCT BULB TIP 10FT TU (MISCELLANEOUS) ×3 IMPLANT

## 2018-04-03 NOTE — Anesthesia Procedure Notes (Signed)
Anesthesia Regional Block: Adductor canal block   Pre-Anesthetic Checklist: ,, timeout performed, Correct Patient, Correct Site, Correct Laterality, Correct Procedure, Correct Position, site marked, Risks and benefits discussed,  Surgical consent,  Pre-op evaluation,  At surgeon's request and post-op pain management  Laterality: Left  Prep: Maximum Sterile Barrier Precautions used, chloraprep       Needles:  Injection technique: Single-shot  Needle Type: Echogenic Stimulator Needle     Needle Length: 9cm  Needle Gauge: 22     Additional Needles:   Procedures:,,,, ultrasound used (permanent image in chart),,,,  Narrative:  Start time: 04/03/2018 7:07 AM End time: 04/03/2018 7:07 AM Injection made incrementally with aspirations every 5 mL.  Performed by: Personally  Anesthesiologist: Freddrick March, MD  Additional Notes: Monitors applied. No increased pain on injection. No increased resistance to injection. Injection made in 5cc increments. Good needle visualization. Patient tolerated procedure well.

## 2018-04-03 NOTE — Anesthesia Postprocedure Evaluation (Signed)
Anesthesia Post Note  Patient: Courtney Shaffer  Procedure(s) Performed: TOTAL KNEE ARTHROPLASTY (Left Knee)     Patient location during evaluation: PACU Anesthesia Type: Regional and Spinal Level of consciousness: oriented and awake and alert Pain management: pain level controlled Vital Signs Assessment: post-procedure vital signs reviewed and stable Respiratory status: spontaneous breathing, respiratory function stable and patient connected to nasal cannula oxygen Cardiovascular status: blood pressure returned to baseline and stable Postop Assessment: no headache, no backache and no apparent nausea or vomiting Anesthetic complications: no    Last Vitals:  Vitals:   04/03/18 1230 04/03/18 1340  BP: (!) 145/63 (!) 119/57  Pulse: 68 71  Resp: 16 16  Temp: (!) 36.4 C 36.9 C  SpO2: 97% 100%    Last Pain:  Vitals:   04/03/18 1340  TempSrc: Oral  PainSc:                  Harshith Pursell L Solon Alban

## 2018-04-03 NOTE — H&P (Signed)
Courtney Shaffer MRN:  [id number] DOB/SEX:  1945/10/20/female  CHIEF COMPLAINT:  Painful left Knee  HISTORY: Patient is a 72 y.o. female presented with a history of pain in the left knee. Onset of symptoms was gradual starting a few years ago with gradually worsening course since that time. Patient has been treated conservatively with over-the-counter NSAIDs and activity modification. Patient currently rates pain in the knee at 10 out of 10 with activity. There is pain at night.  PAST MEDICAL HISTORY: Patient Active Problem List   Diagnosis Date Noted  . 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 Preventative health care 10/12/2015  . Hyponatremia 10/12/2015  . Foot lesion 05/04/2015  . Multilevel degenerative disc disease 04/29/2015  . History of chicken pox   . Osteopenia 08/22/2013  . Hyperlipidemia, mixed 08/22/2013  . Atrophic vaginitis 12/06/2012  . HZV (herpes zoster virus) post herpetic neuralgia 12/06/2012  . Vitamin D deficiency 08/25/2011   Past Medical History:  Diagnosis Date  . Atrophic vaginitis   . 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 Diverticulosis 04/01/2014   Sigmoid and ascending colon  . History of chicken pox   . History of shingles 12/2004  . Hyperlipidemia   . Hyponatremia 10/12/2015  . Multilevel degenerative disc disease 04/29/2015   knees, back  . OA (osteoarthritis)    TMJ  . Osteopenia 08/22/2013  . Postherpetic neuralgia   . 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 Toe fracture, left 07/2011  . Vitamin D deficiency    Past Surgical History:  Procedure Laterality Date  . ADENOIDECTOMY    . APPENDECTOMY    . COLONOSCOPY  11/20/2003   w/Dr.William Austin=diverticulosis&hemorrhoids  . COLONOSCOPY  04/01/2014  . KNEE ARTHROSCOPY Bilateral    Wainer left, Collins right  . WISDOM TOOTH EXTRACTION  1971     MEDICATIONS:   Medications Prior to Admission  Medication Sig Dispense Refill Last Dose  . Ascorbic Acid (VITAMIN C PO) Take 1 tablet by mouth  daily.    03/29/2018  . B Complex Vitamins (VITAMIN-B COMPLEX) TABS Take 1 tablet by mouth daily.    03/29/2018  . budesonide-formoterol (SYMBICORT) 160-4.5 MCG/ACT inhaler Inhale 1 puff into the lungs 2 (two) times daily as needed (for shortness of breath or wheezing).   04/02/2018 at Unknown time  . CALCIUM-MAGNESIUM PO Take 30 mLs by mouth at bedtime.    04/02/2018 at Unknown time  . Glucosamine HCl (GLUCOSAMINE PO) Take 1 tablet by mouth 2 (two) times a week.   03/29/2018  . Omega-3 Fatty Acids (FISH OIL PO) Take 1 capsule by mouth daily.   04/02/2018 at Unknown time  . Polyethyl Glycol-Propyl Glycol (SYSTANE) 0.4-0.3 % SOLN Place 1 drop into both eyes daily as needed (for dry eyes).   04/02/2018 at Unknown time  . Probiotic Product (PROBIOTIC PO) Take 1 capsule by mouth daily.   04/02/2018 at Unknown time  . pseudoephedrine-guaifenesin (MUCINEX D) 60-600 MG 12 hr tablet Take 1 tablet by mouth 2 (two) times daily as needed for congestion.   04/02/2018 at Unknown time  . simvastatin (ZOCOR) 20 MG tablet TAKE 1 TABLET BY MOUTH EVERY DAY IN THE EVENING (Patient taking differently: Take 20 mg by mouth at bedtime. ) 90 tablet 3 03/27/2018  . AMOXICILLIN PO Take by mouth.   03/31/2018    ALLERGIES:  No Known Allergies  REVIEW OF SYSTEMS:  A comprehensive review of systems was negative except for: Musculoskeletal: positive for arthralgias and bone pain  FAMILY HISTORY:   Family History  Problem Relation Age of Onset  . Osteoporosis Mother   . Mental illness Mother        depression  . Hypertension Brother   . Hyperlipidemia Brother   . Diabetes Father   . Hyperlipidemia Father   . Hypertension Father   . Mental illness Daughter        anxiety depression  . Mental illness Maternal Grandmother   . Osteoporosis Maternal Grandmother   . Mental illness Maternal Grandfather   . Mental illness Maternal Aunt   . Mental illness Maternal Uncle   . Vision loss Son   . Colon cancer Neg Hx      SOCIAL HISTORY:   Social History   Tobacco Use  . Smoking status: Never Smoker  . Smokeless tobacco: Never Used  Substance Use Topics  . Alcohol use: No     EXAMINATION:  Vital signs in last 24 hours: Temp:  [98.2 F (36.8 C)] 98.2 F (36.8 C) (12/23 0550) Pulse Rate:  [77] 77 (12/23 0550) Resp:  [12] 12 (12/23 0550) BP: (143)/(74) 143/74 (12/23 0550) SpO2:  [100 %] 100 % (12/23 0550) Weight:  [58.1 kg] 58.1 kg (12/23 0559)  BP (!) 143/74   Pulse 77   Temp 98.2 F (36.8 C) (Oral)   Resp 12   Ht 5\' 5"  (1.651 m)   Wt 58.1 kg   SpO2 100%   BMI 21.30 kg/m   General Appearance:    Alert, cooperative, no distress, appears stated age  Head:    Normocephalic, without obvious abnormality, atraumatic  Eyes:    PERRL, conjunctiva/corneas clear, EOM's intact, fundi    benign, both eyes  Ears:    Normal TM's and external ear canals, both ears  Nose:   Nares normal, septum midline, mucosa normal, no drainage    or sinus tenderness  Throat:   Lips, mucosa, and tongue normal; teeth and gums normal  Neck:   Supple, symmetrical, trachea midline, no adenopathy;    thyroid:  no enlargement/tenderness/nodules; no carotid   bruit or JVD  Back:     Symmetric, no curvature, ROM normal, no CVA tenderness  Lungs:     Clear to auscultation bilaterally, respirations unlabored  Chest Wall:    No tenderness or deformity   Heart:    Regular rate and rhythm, S1 and S2 normal, no murmur, rub   or gallop  Breast Exam:    No tenderness, masses, or nipple abnormality  Abdomen:     Soft, non-tender, bowel sounds active all four quadrants,    no masses, no organomegaly  Genitalia:    Normal female without lesion, discharge or tenderness  Rectal:    Normal tone, no masses or tenderness;   guaiac negative stool  Extremities:   Extremities normal, atraumatic, no cyanosis or edema  Pulses:   2+ and symmetric all extremities  Skin:   Skin color, texture, turgor normal, no rashes or lesions  Lymph  nodes:   Cervical, supraclavicular, and axillary nodes normal  Neurologic:   CNII-XII intact, normal strength, sensation and reflexes    throughout    Musculoskeletal:  ROM 0-120, Ligaments intact,  Imaging Review Plain radiographs demonstrate severe degenerative joint disease of the left knee. The overall alignment is neutral. The bone quality appears to be good for age and reported activity level.  Assessment/Plan: Primary osteoarthritis, left knee   The patient history, physical examination and imaging studies are consistent with advanced degenerative joint  disease of the left knee. The patient has failed conservative treatment.  The clearance notes were reviewed.  After discussion with the patient it was felt that Total Knee Replacement was indicated. The procedure,  risks, and benefits of total knee arthroplasty were presented and reviewed. The risks including but not limited to aseptic loosening, infection, blood clots, vascular injury, stiffness, patella tracking problems complications among others were discussed. The patient acknowledged the explanation, agreed to proceed with the plan.  Preoperative templating of the joint replacement has been completed, documented, and submitted to the Operating Room personnel in order to optimize intra-operative equipment management.    Patient's anticipated LOS is less than 2 midnights, meeting these requirements: - Lives within 1 hour of care - Has a competent adult at home to recover with post-op recover - NO history of  - Chronic pain requiring opiods  - Diabetes  - Coronary Artery Disease  - Heart failure  - Heart attack  - Stroke  - DVT/VTE  - Cardiac arrhythmia  - Respiratory Failure/COPD  - Renal failure  - Anemia  - Advanced Liver disease       Donia Ast 04/03/2018, 6:19 AM

## 2018-04-03 NOTE — Transfer of Care (Signed)
Immediate Anesthesia Transfer of Care Note  Patient: Courtney Shaffer  Procedure(s) Performed: TOTAL KNEE ARTHROPLASTY (Left Knee)  Patient Location: PACU  Anesthesia Type:Spinal  Level of Consciousness: awake, oriented, drowsy and patient cooperative  Airway & Oxygen Therapy: Patient Spontanous Breathing and Patient connected to nasal cannula oxygen  Post-op Assessment: Report given to RN and Post -op Vital signs reviewed and stable  Post vital signs: Reviewed and stable  Last Vitals:  Vitals Value Taken Time  BP 107/57 04/03/2018  9:16 AM  Temp    Pulse 71 04/03/2018  9:17 AM  Resp    SpO2 100 % 04/03/2018  9:17 AM  Vitals shown include unvalidated device data.  Last Pain:  Vitals:   04/03/18 0559  TempSrc:   PainSc: 1       Patients Stated Pain Goal: 4 (84/85/92 7639)  Complications: No apparent anesthesia complications

## 2018-04-03 NOTE — Evaluation (Signed)
Physical Therapy Evaluation Patient Details Name: Courtney Shaffer MRN: [id number] DOB: 08-Nov-1945 Today's Date: 04/03/2018   History of Present Illness  72 yo female s/p L TKA 04/03/18  Clinical Impression  On eval POD 0, pt was Min guard-Min assist for mobility. She walked ~25 feet with a RW. Pt reported impaired sensation of L LE so limited ambulation distance for safety reasons. Minimal pain with activity. Will continue to follow and progress activity as tolerated.     Follow Up Recommendations Follow surgeon's recommendation for DC plan and follow-up therapies    Equipment Recommendations  None recommended by PT    Recommendations for Other Services       Precautions / Restrictions Precautions Precautions: Fall;Knee Restrictions Weight Bearing Restrictions: No Other Position/Activity Restrictions: WBAT      Mobility  Bed Mobility Overal bed mobility: Needs Assistance Bed Mobility: Supine to Sit     Supine to sit: Min guard;HOB elevated     General bed mobility comments: close guard for safety.   Transfers Overall transfer level: Needs assistance Equipment used: Rolling walker (2 wheeled) Transfers: Sit to/from Stand Sit to Stand: Min guard;From elevated surface         General transfer comment: close guard for safety. VCs safety, hand/LE placement.   Ambulation/Gait Ambulation/Gait assistance: Min assist Gait Distance (Feet): 25 Feet Assistive device: Rolling walker (2 wheeled) Gait Pattern/deviations: Step-to pattern     General Gait Details: Assist to steady throughout distance. VCS safety, technique, sequence.  Followed with recliner for safety.   Stairs            Wheelchair Mobility    Modified Rankin (Stroke Patients Only)       Balance Overall balance assessment: Needs assistance           Standing balance-Leahy Scale: Poor                               Pertinent Vitals/Pain Pain Assessment: 0-10 Pain Score:  3  Pain Location: L LE Pain Descriptors / Indicators: Numbness;Tingling;Sore Pain Intervention(s): Monitored during session;Repositioned;Ice applied    Home Living Family/patient expects to be discharged to:: Private residence Living Arrangements: Spouse/significant other Available Help at Discharge: Family Type of Home: House Home Access: Stairs to enter Entrance Stairs-Rails: Psychiatric nurse of Steps: 4 Home Layout: Two level;Able to live on main level with bedroom/bathroom Home Equipment: Gilford Rile - 2 wheels      Prior Function Level of Independence: Independent               Hand Dominance        Extremity/Trunk Assessment   Upper Extremity Assessment Upper Extremity Assessment: Overall WFL for tasks assessed    Lower Extremity Assessment Lower Extremity Assessment: Generalized weakness(s/p L TKA)    Cervical / Trunk Assessment Cervical / Trunk Assessment: Normal  Communication   Communication: No difficulties  Cognition Arousal/Alertness: Awake/alert Behavior During Therapy: WFL for tasks assessed/performed Overall Cognitive Status: Within Functional Limits for tasks assessed                                        General Comments      Exercises     Assessment/Plan    PT Assessment Patient needs continued PT services  PT Problem List Decreased strength;Decreased balance;Decreased range of motion;Decreased mobility;Decreased activity tolerance;Pain;Decreased knowledge  of use of DME       PT Treatment Interventions DME instruction;Gait training;Functional mobility training;Therapeutic activities;Balance training;Patient/family education;Therapeutic exercise    PT Goals (Current goals can be found in the Care Plan section)  Acute Rehab PT Goals Patient Stated Goal: regain PLOF/independence PT Goal Formulation: With patient Time For Goal Achievement: 04/17/18 Potential to Achieve Goals: Good    Frequency  7X/week   Barriers to discharge        Co-evaluation               AM-PAC PT "6 Clicks" Mobility  Outcome Measure Help needed turning from your back to your side while in a flat bed without using bedrails?: A Little Help needed moving from lying on your back to sitting on the side of a flat bed without using bedrails?: A Little Help needed moving to and from a bed to a chair (including a wheelchair)?: A Little Help needed standing up from a chair using your arms (e.g., wheelchair or bedside chair)?: A Little Help needed to walk in hospital room?: A Little Help needed climbing 3-5 steps with a railing? : A Little 6 Click Score: 18    End of Session Equipment Utilized During Treatment: Gait belt Activity Tolerance: Patient tolerated treatment well Patient left: in chair;with call bell/phone within reach   PT Visit Diagnosis: Difficulty in walking, not elsewhere classified (R26.2);Pain;Other abnormalities of gait and mobility (R26.89) Pain - Right/Left: Left Pain - part of body: Knee    Time: 8889-1694 PT Time Calculation (min) (ACUTE ONLY): 32 min   Charges:   PT Evaluation $PT Eval Low Complexity: 1 Low PT Treatments $Gait Training: 8-22 mins        Weston Anna, PT Acute Rehabilitation Services Pager: [phone] Office: [phone]

## 2018-04-03 NOTE — Anesthesia Procedure Notes (Signed)
Spinal  Patient location during procedure: OR Start time: 04/03/2018 7:35 AM End time: 04/03/2018 7:44 AM Staffing Resident/CRNA: Ofilia Neas, CRNA Performed: resident/CRNA  Preanesthetic Checklist Completed: patient identified, site marked, surgical consent, pre-op evaluation, timeout performed, IV checked, risks and benefits discussed and monitors and equipment checked Spinal Block Patient position: sitting Prep: DuraPrep Patient monitoring: heart rate, continuous pulse ox and blood pressure Approach: midline Location: L2-3 Injection technique: single-shot Needle Needle type: Spinocan  Needle gauge: 22 G Needle length: 9 cm Additional Notes Kit expiration date checked, negative heme, paresthesia.  tolerated well.

## 2018-04-04 DIAGNOSIS — Z96652 Presence of left artificial knee joint: Secondary | ICD-10-CM | POA: Diagnosis not present

## 2018-04-04 DIAGNOSIS — M1712 Unilateral primary osteoarthritis, left knee: Secondary | ICD-10-CM | POA: Diagnosis not present

## 2018-04-04 LAB — BASIC METABOLIC PANEL
Anion gap: 7 (ref 5–15)
BUN: 7 mg/dL — ABNORMAL LOW (ref 8–23)
CO2: 27 mmol/L (ref 22–32)
Calcium: 8.5 mg/dL — ABNORMAL LOW (ref 8.9–10.3)
Chloride: 100 mmol/L (ref 98–111)
Creatinine, Ser: 0.61 mg/dL (ref 0.44–1.00)
GFR calc Af Amer: >60 mL/min (ref >60)
Glucose, Bld: 111 mg/dL — ABNORMAL HIGH (ref 70–99)
Potassium: 4 mmol/L (ref 3.5–5.1)
Sodium: 134 mmol/L — ABNORMAL LOW (ref 135–145)

## 2018-04-04 LAB — CBC
HCT: 33.1 % — ABNORMAL LOW (ref 36.0–46.0)
Hemoglobin: 10.8 g/dL — ABNORMAL LOW (ref 12.0–15.0)
MCH: 32 pg (ref 26.0–34.0)
MCHC: 32.6 g/dL (ref 30.0–36.0)
MCV: 97.9 fL (ref 80.0–100.0)
Platelets: 264 10*3/uL (ref 150–400)
RBC: 3.38 MIL/uL — ABNORMAL LOW (ref 3.87–5.11)
RDW: 12.7 % (ref 11.5–15.5)
WBC: 9.8 10*3/uL (ref 4.0–10.5)
nRBC: 0 % (ref 0.0–0.2)

## 2018-04-04 MED ORDER — METHOCARBAMOL 500 MG PO TABS: 500.0000 mg | ORAL_TABLET | Freq: Four times a day (QID) | ORAL | 0 refills | Status: DC | PRN

## 2018-04-04 MED ORDER — ASPIRIN 325 MG PO TBEC: 325.0000 mg | DELAYED_RELEASE_TABLET | Freq: Two times a day (BID) | ORAL | 0 refills | Status: DC

## 2018-04-04 MED ORDER — OXYCODONE HCL 5 MG PO TABS: 5.0000 mg | ORAL_TABLET | Freq: Four times a day (QID) | ORAL | 0 refills | Status: DC | PRN

## 2018-04-04 MED ORDER — ACETAMINOPHEN 500 MG PO TABS: 1000.0000 mg | ORAL_TABLET | Freq: Four times a day (QID) | ORAL | 0 refills | Status: DC

## 2018-04-04 NOTE — Progress Notes (Signed)
SPORTS MEDICINE AND JOINT REPLACEMENT  Lara Mulch, MD    Carlyon Shadow, PA-C [address], [place], [place]  [address]                             [phone]   PROGRESS NOTE  Subjective:  negative for Chest Pain  negative for Shortness of Breath  negative for Nausea/Vomiting   negative for Calf Pain  negative for Bowel Movement   Tolerating Diet: yes         Patient reports pain as 3 on 0-10 scale.    Objective: Vital signs in last 24 hours:    Patient Vitals for the past 24 hrs:  BP Temp Temp src Pulse Resp SpO2  04/04/18 0505 125/67 (!) 97.5 F (36.4 C) Oral 69 17 100 %  04/04/18 0130 (!) 100/53 (!) 97.5 F (36.4 C) - 64 18 94 %  04/03/18 2112 113/65 97.7 F (36.5 C) Oral 66 17 98 %  04/03/18 1750 118/72 97.8 F (36.6 C) Oral 72 16 100 %  04/03/18 1340 (!) 119/57 98.5 F (36.9 C) Oral 71 16 100 %  04/03/18 1230 (!) 145/63 (!) 97.5 F (36.4 C) Oral 68 16 97 %  04/03/18 1132 138/69 - - 63 16 (!) 88 %  04/03/18 1030 (!) 122/97 97.6 F (36.4 C) Axillary 65 16 100 %  04/03/18 1000 129/61 (!) 97.5 F (36.4 C) - 64 15 100 %  04/03/18 0945 121/60 - - 64 14 100 %  04/03/18 0930 104/72 - - 70 15 94 %  04/03/18 0915 (!) 107/57 (!) 97.5 F (36.4 C) - 76 18 100 %    @flow {1959:LAST@   Intake/Output from previous day:   12/23 0701 - 12/24 0700 In: 2881.1 [P.O.:940; I.V.:1841.1] Out: 3976 [Urine:4550]   Intake/Output this shift:   12/23 1901 - 12/24 0700 In: 1477.7 [P.O.:940; I.V.:537.7] Out: 2300 [Urine:2300]   Intake/Output      12/23 0701 - 12/24 0700   P.O. 940   I.V. (mL/kg) 1841.1 (31.7)   IV Piggyback 100   Total Intake(mL/kg) 2881.1 (49.6)   Urine (mL/kg/hr) 4550 (3.3)   Total Output 4550   Net -1668.9       Urine Occurrence 900 x      LABORATORY DATA: Recent Labs    03/28/18 0956 04/04/18 0537  WBC 5.2 9.8  HGB 13.1 10.8*  HCT 39.9 33.1*  PLT 327 264   Recent Labs    03/28/18 0956 04/04/18 0537  NA 131* 134*  K 4.1 4.0  CL  96* 100  CO2 27 27  BUN 10 7*  CREATININE 0.56 0.61  GLUCOSE 96 111*  CALCIUM 9.1 8.5*   No results found for: INR, PROTIME  Examination:  General appearance: alert, cooperative and no distress Extremities: extremities normal, atraumatic, no cyanosis or edema  Wound Exam: clean, dry, intact   Drainage:  None: wound tissue dry  Motor Exam: Quadriceps and Hamstrings Intact  Sensory Exam: Superficial Peroneal, Deep Peroneal and Tibial normal   Assessment:    1 Day Post-Op  Procedure(s) (LRB): TOTAL KNEE ARTHROPLASTY (Left)  ADDITIONAL DIAGNOSIS:  Active Problems:   S/P total knee replacement     Plan: Physical Therapy as ordered Weight Bearing as Tolerated (WBAT)  DVT Prophylaxis:  Aspirin  DISCHARGE PLAN: Home  DISCHARGE NEEDS: HHPT       Patient's anticipated LOS is less than 2 midnights, meeting these requirements: -  Lives within 1 hour of care - Has a competent adult at home to recover with post-op recover - NO history of  - Chronic pain requiring opiods  - Diabetes  - Coronary Artery Disease  - Heart failure  - Heart attack  - Stroke  - DVT/VTE  - Cardiac arrhythmia  - Respiratory Failure/COPD  - Renal failure  - Anemia  - Advanced Liver disease        Donia Ast 04/04/2018, 6:54 AM

## 2018-04-04 NOTE — Op Note (Signed)
TOTAL KNEE REPLACEMENT OPERATIVE NOTE:  04/03/2018  2:29 AM  PATIENT:  Courtney Shaffer  72 y.o. female  PRE-OPERATIVE DIAGNOSIS:  LT. KNEE OSTEOARTHRITIS  POST-OPERATIVE DIAGNOSIS:  LT. KNEE OSTEOARTHRITIS  PROCEDURE:  Procedure(s): TOTAL KNEE ARTHROPLASTY  SURGEON:  Surgeon(s): Vickey Huger, MD  PHYSICIAN ASSISTANT: Carlyon Shadow, PA-C  ANESTHESIA:   spinal  SPECIMEN: None  COUNTS:  Correct  TOURNIQUET:   Total Tourniquet Time Documented: Thigh (Left) - 37 minutes Total: Thigh (Left) - 37 minutes   DICTATION:  Indication for procedure:    The patient is a 72 y.o. female who has failed conservative treatment for LT. KNEE OSTEOARTHRITIS.  Informed consent was obtained prior to anesthesia. The risks versus benefits of the operation were explain and in a way the patient can, and did, understand.   On the implant demand matching protocol, this patient scored 10.  Therefore, this patient was not receive a polyethylene insert with vitamin E which is a high demand implant.  Description of procedure:     The patient was taken to the operating room and placed under anesthesia.  The patient was positioned in the usual fashion taking care that all body parts were adequately padded and/or protected.  A tourniquet was applied and the leg prepped and draped in the usual sterile fashion.  The extremity was exsanguinated with the esmarch and tourniquet inflated to 350 mmHg.  Pre-operative range of motion was normal.  The knee was in 5 degree of mild varus.  A midline incision approximately 6-7 inches long was made with a #10 blade.  A new blade was used to make a parapatellar arthrotomy going 2-3 cm into the quadriceps tendon, over the patella, and alongside the medial aspect of the patellar tendon.  A synovectomy was then performed with the #10 blade and forceps. I then elevated the deep MCL off the medial tibial metaphysis subperiosteally around to the semimembranosus attachment.    I  everted the patella and used calipers to measure patellar thickness.  I used the reamer to ream down to appropriate thickness to recreate the native thickness.  I then removed excess bone with the rongeur and sagittal saw.  I used the appropriately sized template and drilled the three lug holes.  I then put the trial in place and measured the thickness with the calipers to ensure recreation of the native thickness.  The trial was then removed and the patella subluxed and the knee brought into flexion.  A homan retractor was place to retract and protect the patella and lateral structures.  A Z-retractor was place medially to protect the medial structures.  The extra-medullary alignment system was used to make cut the tibial articular surface perpendicular to the anamotic axis of the tibia and in 3 degrees of posterior slope.  The cut surface and alignment jig was removed.  I then used the intramedullary alignment guide to make a 6 valgus cut on the distal femur.  I then marked out the epicondylar axis on the distal femur.  The posterior condylar axis measured 3 degrees.  I then used the anterior referencing sizer and measured the femur to be a size 6.  The 4-In-1 cutting block was screwed into place in external rotation matching the posterior condylar angle, making our cuts perpendicular to the epicondylar axis.  Anterior, posterior and chamfer cuts were made with the sagittal saw.  The cutting block and cut pieces were removed.  A lamina spreader was placed in 90 degrees of flexion.  The ACL, PCL, menisci, and posterior condylar osteophytes were removed.  A 12 mm spacer blocked was found to offer good flexion and extension gap balance after minimal in degree releasing.   The scoop retractor was then placed and the femoral finishing block was pinned in place.  The small sagittal saw was used as well as the lug drill to finish the femur.  The block and cut surfaces were removed and the medullary canal hole  filled with autograft bone from the cut pieces.  The tibia was delivered forward in deep flexion and external rotation.  A size D tray was selected and pinned into place centered on the medial 1/3 of the tibial tubercle.  The reamer and keel was used to prepare the tibia through the tray.    I then trialed with the size 6 femur, size D tibia, a 12 mm insert and the 32 patella.  I had excellent flexion/extension gap balance, excellent patella tracking.  Flexion was full and beyond 120 degrees; extension was zero.  These components were chosen and the staff opened them to me on the back table while the knee was lavaged copiously and the cement mixed.  The soft tissue was infiltrated with 60cc of exparel 1.3% through a 21 gauge needle.  I cemented in the components and removed all excess cement.  The polyethylene tibial component was snapped into place and the knee placed in extension while cement was hardening.  The capsule was infilltrated with a 60cc exparel/marcaine/saline mixture.   Once the cement was hard, the tourniquet was let down.  Hemostasis was obtained.  The arthrotomy was closed using a #1 stratofix running suture.  The deep soft tissues were closed with #0 vicryls and the subcuticular layer closed with #2-0 vicryl.  The skin was reapproximated and closed with 3.0 Monocryl.  The wound was covered with steristrips, aquacel dressing, and a TED stocking.   The patient was then awakened, extubated, and taken to the recovery room in stable condition.  BLOOD LOSS:  754GB COMPLICATIONS:  None.  PLAN OF CARE: Admit for overnight observation  PATIENT DISPOSITION:  PACU - hemodynamically stable.   Delay start of Pharmacological VTE agent (>24hrs) due to surgical blood loss or risk of bleeding:  not applicable  Please fax a copy of this op note to my office at [phone] (please only include page 1 and 2 of the Case Information op note)

## 2018-04-04 NOTE — Progress Notes (Signed)
Reviewed discharge instructions, medications and prescriptions. Patient verbalizes understanding. Patient has no further questions at this time. Waiting on last session of PT and her husband to return.

## 2018-04-04 NOTE — Care Management Note (Addendum)
Case Management Note  Patient Details  Name: Courtney Shaffer MRN: [id number] Date of Birth: 11/15/1945  Subjective/Objective:                    Action/Plan: Pt states she do not need any DME, home with [facility]   Expected Discharge Date:  04/04/18               Expected Discharge Plan:  [place]  In-House Referral:     Discharge planning Services  CM Consult  Post Acute Care Choice:    Choice offered to:     DME Arranged:    DME Agency:     HH Arranged:  PT [place]:  [facility] (now [facility]  Status of Service:  Completed, signed off  If discussed at [employer] of Stay Meetings, dates discussed:    Additional CommentsPurcell Mouton, RN 04/04/2018, 12:10 PM

## 2018-04-04 NOTE — Progress Notes (Addendum)
Physical Therapy Treatment Patient Details Name: Courtney Shaffer MRN: [id number] DOB: 11/03/1945 Today's Date: 04/04/2018    History of Present Illness 72 yo female s/p L TKA 04/03/18    PT Comments    Reviewed/practiced gait and stair training. Issued HEP for pt to perform at home until she begins HHPT. Pt c/o fatigue and lightheadedness towards end of session this afternoon. BP was 76/47 while sitting EOB. Assisted pt back to bed. Made RN aware of BP reading. Pt has met all PT goals however she is experiencing hypotension. Will defer d/c decision to RN/MD.     Follow Up Recommendations  Follow surgeon's recommendation for DC plan and follow-up therapies     Equipment Recommendations  None recommended by PT    Recommendations for Other Services       Precautions / Restrictions Precautions Precautions: Fall;Knee Restrictions Weight Bearing Restrictions: No Other Position/Activity Restrictions: WBAT    Mobility  Bed Mobility Overal bed mobility: Needs Assistance Bed Mobility: Supine to Sit;Sit to Supine     Supine to sit: Supervision Sit to supine: Min assist   General bed mobility comments: Assist for R LE.   Transfers Overall transfer level: Needs assistance Equipment used: Rolling walker (2 wheeled) Transfers: Sit to/from Stand Sit to Stand: Min guard         General transfer comment: close guard for safety. VCs safety, hand/LE placement.   Ambulation/Gait Ambulation/Gait assistance: Min guard Gait Distance (Feet): 125 Feet Assistive device: Rolling walker (2 wheeled) Gait Pattern/deviations: Step-through pattern;Decreased stride length     General Gait Details: Close guard for safety. VCs safety, step length, heel-toe pattern. Pt c/o feeling lightheaded and fatigued towards end of distance.    Stairs Stairs: Yes Stairs assistance: Min guard Stair Management: One rail Left;One rail Right;Forwards;With cane Number of Stairs: 5 General stair  comments: Up and over portable steps x 2. VCs safety, technique, sequence. Close guard for safety.    Wheelchair Mobility    Modified Rankin (Stroke Patients Only)       Balance Overall balance assessment: Needs assistance         Standing balance support: Bilateral upper extremity supported Standing balance-Leahy Scale: Poor                              Cognition Arousal/Alertness: Awake/alert Behavior During Therapy: WFL for tasks assessed/performed Overall Cognitive Status: Within Functional Limits for tasks assessed                                        Exercises Total Joint Exercises Ankle Circles/Pumps: AROM;Both;10 reps;Supine Quad Sets: AROM;Both;10 reps;Supine Heel Slides: AAROM;Left;10 reps;Supine Hip ABduction/ADduction: AAROM;Left;10 reps;Supine Straight Leg Raises: AAROM;Left;10 reps;Supine Knee Flexion: AAROM;Left;10 reps;Seated Goniometric ROM: ~5-95 degrees    General Comments        Pertinent Vitals/Pain Pain Assessment: 0-10 Pain Score: 5  Pain Location: L LE Pain Descriptors / Indicators: Discomfort;Sore Pain Intervention(s): Monitored during session;Repositioned;Ice applied    Home Living                      Prior Function            PT Goals (current goals can now be found in the care plan section) Progress towards PT goals: Progressing toward goals    Frequency    7X/week  PT Plan Current plan remains appropriate    Co-evaluation              AM-PAC PT "6 Clicks" Mobility   Outcome Measure  Help needed turning from your back to your side while in a flat bed without using bedrails?: A Little Help needed moving from lying on your back to sitting on the side of a flat bed without using bedrails?: A Little Help needed moving to and from a bed to a chair (including a wheelchair)?: A Little Help needed standing up from a chair using your arms (e.g., wheelchair or bedside  chair)?: A Little Help needed to walk in hospital room?: A Little Help needed climbing 3-5 steps with a railing? : A Little 6 Click Score: 18    End of Session Equipment Utilized During Treatment: Gait belt Activity Tolerance: Patient tolerated treatment well Patient left: in bed;with call bell/phone within reach Nurse Communication: (low BP reading) PT Visit Diagnosis: Difficulty in walking, not elsewhere classified (R26.2);Pain;Other abnormalities of gait and mobility (R26.89) Pain - Right/Left: Left Pain - part of body: Knee     Time: 1638-4536 PT Time Calculation (min) (ACUTE ONLY): 31 min  Charges:  $Gait Training: 23-37 mins $Therapeutic Exercise: 8-22 mins                        Weston Anna, PT Acute Rehabilitation Services Pager: [phone] Office: [phone]

## 2018-04-04 NOTE — Discharge Summary (Signed)
SPORTS MEDICINE & JOINT REPLACEMENT   Lara Mulch, MD   Carlyon Shadow, PA-C [address] [place], [place], [place]  [address]                             [phone]  PATIENT ID: Courtney Shaffer        MRN:  [id number]          DOB/AGE: [birth year] / 72 y.o.    DISCHARGE SUMMARY  ADMISSION DATE:    04/03/2018 DISCHARGE DATE:   04/04/2018   ADMISSION DIAGNOSIS: LT. KNEE OSTEOARTHRITIS    DISCHARGE DIAGNOSIS:  LT. KNEE OSTEOARTHRITIS    ADDITIONAL DIAGNOSIS: Active Problems:   S/P total knee replacement  Past Medical History:  Diagnosis Date  . Atrophic vaginitis   . 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 Diverticulosis 04/01/2014   Sigmoid and ascending colon  . History of chicken pox   . History of shingles 12/2004  . Hyperlipidemia   . Hyponatremia 10/12/2015  . Multilevel degenerative disc disease 04/29/2015   knees, back  . OA (osteoarthritis)    TMJ  . Osteopenia 08/22/2013  . Postherpetic neuralgia   . 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 Toe fracture, left 07/2011  . Vitamin D deficiency     PROCEDURE: Procedure(s): TOTAL KNEE ARTHROPLASTY on 04/03/2018  CONSULTS:    HISTORY:  See H&P in chart  HOSPITAL COURSE:  Courtney Shaffer is a 72 y.o. admitted on 04/03/2018 and found to have a diagnosis of LT. KNEE OSTEOARTHRITIS.  After appropriate laboratory studies were obtained  they were taken to the operating room on 04/03/2018 and underwent Procedure(s): TOTAL KNEE ARTHROPLASTY.   They were given perioperative antibiotics:  Anti-infectives (From admission, onward)   Start     Dose/Rate Route Frequency Ordered Stop   04/03/18 1400  ceFAZolin (ANCEF) IVPB 2g/100 mL premix     2 g 200 mL/hr over 30 Minutes Intravenous Every 6 hours 04/03/18 1033 04/03/18 2143   04/03/18 0600  ceFAZolin (ANCEF) IVPB 2g/100 mL premix     2 g 200 mL/hr over 30 Minutes Intravenous On call to O.R. 04/03/18 0258 04/03/18 0736    .  Patient given tranexamic acid IV or topical and exparel  intra-operatively.  Tolerated the procedure well.    POD# 1: Vital signs were stable.  Patient denied Chest pain, shortness of breath, or calf pain.  Patient was started on Aspirin twice daily at 8am.  Consults to PT, OT, and care management were made.  The patient was weight bearing as tolerated.  CPM was placed on the operative leg 0-90 degrees for 6-8 hours a day. When out of the CPM, patient was placed in the foam block to achieve full extension. Incentive spirometry was taught.  Dressing was changed.       POD #2, Continued  PT for ambulation and exercise program.  IV saline locked.  O2 discontinued.    The remainder of the hospital course was dedicated to ambulation and strengthening.   The patient was discharged on 1 Day Post-Op in  Good condition.  Blood products given:none  DIAGNOSTIC STUDIES: Recent vital signs:  Patient Vitals for the past 24 hrs:  BP Temp Temp src Pulse Resp SpO2  04/04/18 0505 125/67 (!) 97.5 F (36.4 C) Oral 69 17 100 %  04/04/18 0130 (!) 100/53 (!) 97.5 F (36.4 C) - 64 18 94 %  04/03/18 2112 113/65 97.7 F (36.5 C) Oral  66 17 98 %  04/03/18 1750 118/72 97.8 F (36.6 C) Oral 72 16 100 %  04/03/18 1340 (!) 119/57 98.5 F (36.9 C) Oral 71 16 100 %  04/03/18 1230 (!) 145/63 (!) 97.5 F (36.4 C) Oral 68 16 97 %  04/03/18 1132 138/69 - - 63 16 (!) 88 %  04/03/18 1030 (!) 122/97 97.6 F (36.4 C) Axillary 65 16 100 %  04/03/18 1000 129/61 (!) 97.5 F (36.4 C) - 64 15 100 %  04/03/18 0945 121/60 - - 64 14 100 %  04/03/18 0930 104/72 - - 70 15 94 %  04/03/18 0915 (!) 107/57 (!) 97.5 F (36.4 C) - 76 18 100 %       Recent laboratory studies: Recent Labs    03/28/18 0956 04/04/18 0537  WBC 5.2 9.8  HGB 13.1 10.8*  HCT 39.9 33.1*  PLT 327 264   Recent Labs    03/28/18 0956 04/04/18 0537  NA 131* 134*  K 4.1 4.0  CL 96* 100  CO2 27 27  BUN 10 7*  CREATININE 0.56 0.61  GLUCOSE 96 111*  CALCIUM 9.1 8.5*   No results found for: INR,  PROTIME   Recent Radiographic Studies :  No results found.  DISCHARGE INSTRUCTIONS: Discharge Instructions    Call MD / Call 911   Complete by:  As directed    If you experience chest pain or shortness of breath, CALL 911 and be transported to the hospital emergency room.  If you develope a fever above 101 F, pus (white drainage) or increased drainage or redness at the wound, or calf pain, call your surgeon's office.   Constipation Prevention   Complete by:  As directed    Drink plenty of fluids.  Prune juice may be helpful.  You may use a stool softener, such as Colace (over the counter) 100 mg twice a day.  Use MiraLax (over the counter) for constipation as needed.   Diet - low sodium heart healthy   Complete by:  As directed    Discharge instructions   Complete by:  As directed    INSTRUCTIONS AFTER JOINT REPLACEMENT   Remove items at home which could result in a fall. This includes throw rugs or furniture in walking pathways ICE to the affected joint every three hours while awake for 30 minutes at a time, for at least the first 3-5 days, and then as needed for pain and swelling.  Continue to use ice for pain and swelling. You may notice swelling that will progress down to the foot and ankle.  This is normal after surgery.  Elevate your leg when you are not up walking on it.   Continue to use the breathing machine you got in the hospital (incentive spirometer) which will help keep your temperature down.  It is common for your temperature to cycle up and down following surgery, especially at night when you are not up moving around and exerting yourself.  The breathing machine keeps your lungs expanded and your temperature down.   DIET:  As you were doing prior to hospitalization, we recommend a well-balanced diet.  DRESSING / WOUND CARE / SHOWERING  Keep the surgical dressing until follow up.  The dressing is water proof, so you can shower without any extra covering.  IF THE DRESSING  FALLS OFF or the wound gets wet inside, change the dressing with sterile gauze.  Please use good hand washing techniques before changing the dressing.  Do not use any lotions or creams on the incision until instructed by your surgeon.    ACTIVITY  Increase activity slowly as tolerated, but follow the weight bearing instructions below.   No driving for 6 weeks or until further direction given by your physician.  You cannot drive while taking narcotics.  No lifting or carrying greater than 10 lbs. until further directed by your surgeon. Avoid periods of inactivity such as sitting longer than an hour when not asleep. This helps prevent blood clots.  You may return to work once you are authorized by your doctor.     WEIGHT BEARING   Weight bearing as tolerated with assist device (walker, cane, etc) as directed, use it as long as suggested by your surgeon or therapist, typically at least 4-6 weeks.   EXERCISES  Results after joint replacement surgery are often greatly improved when you follow the exercise, range of motion and muscle strengthening exercises prescribed by your doctor. Safety measures are also important to protect the joint from further injury. Any time any of these exercises cause you to have increased pain or swelling, decrease what you are doing until you are comfortable again and then slowly increase them. If you have problems or questions, call your caregiver or physical therapist for advice.   Rehabilitation is important following a joint replacement. After just a few days of immobilization, the muscles of the leg can become weakened and shrink (atrophy).  These exercises are designed to build up the tone and strength of the thigh and leg muscles and to improve motion. Often times heat used for twenty to thirty minutes before working out will loosen up your tissues and help with improving the range of motion but do not use heat for the first two weeks following surgery (sometimes  heat can increase post-operative swelling).   These exercises can be done on a training (exercise) mat, on the floor, on a table or on a bed. Use whatever works the best and is most comfortable for you.    Use music or television while you are exercising so that the exercises are a pleasant break in your day. This will make your life better with the exercises acting as a break in your routine that you can look forward to.   Perform all exercises about fifteen times, three times per day or as directed.  You should exercise both the operative leg and the other leg as well.   Exercises include:   Quad Sets - Tighten up the muscle on the front of the thigh (Quad) and hold for 5-10 seconds.   Straight Leg Raises - With your knee straight (if you were given a brace, keep it on), lift the leg to 60 degrees, hold for 3 seconds, and slowly lower the leg.  Perform this exercise against resistance later as your leg gets stronger.  Leg Slides: Lying on your back, slowly slide your foot toward your buttocks, bending your knee up off the floor (only go as far as is comfortable). Then slowly slide your foot back down until your leg is flat on the floor again.  Angel Wings: Lying on your back spread your legs to the side as far apart as you can without causing discomfort.  Hamstring Strength:  Lying on your back, push your heel against the floor with your leg straight by tightening up the muscles of your buttocks.  Repeat, but this time bend your knee to a comfortable angle, and push your heel against  the floor.  You may put a pillow under the heel to make it more comfortable if necessary.   A rehabilitation program following joint replacement surgery can speed recovery and prevent re-injury in the future due to weakened muscles. Contact your doctor or a physical therapist for more information on knee rehabilitation.    CONSTIPATION  Constipation is defined medically as fewer than three stools per week and severe  constipation as less than one stool per week.  Even if you have a regular bowel pattern at home, your normal regimen is likely to be disrupted due to multiple reasons following surgery.  Combination of anesthesia, postoperative narcotics, change in appetite and fluid intake all can affect your bowels.   YOU MUST use at least one of the following options; they are listed in order of increasing strength to get the job done.  They are all available over the counter, and you may need to use some, POSSIBLY even all of these options:    Drink plenty of fluids (prune juice may be helpful) and high fiber foods Colace 100 mg by mouth twice a day  Senokot for constipation as directed and as needed Dulcolax (bisacodyl), take with full glass of water  Miralax (polyethylene glycol) once or twice a day as needed.  If you have tried all these things and are unable to have a bowel movement in the first 3-4 days after surgery call either your surgeon or your primary doctor.    If you experience loose stools or diarrhea, hold the medications until you stool forms back up.  If your symptoms do not get better within 1 week or if they get worse, check with your doctor.  If you experience "the worst abdominal pain ever" or develop nausea or vomiting, please contact the office immediately for further recommendations for treatment.   ITCHING:  If you experience itching with your medications, try taking only a single pain pill, or even half a pain pill at a time.  You can also use Benadryl over the counter for itching or also to help with sleep.   TED HOSE STOCKINGS:  Use stockings on both legs until for at least 2 weeks or as directed by physician office. They may be removed at night for sleeping.  MEDICATIONS:  See your medication summary on the "After Visit Summary" that nursing will review with you.  You may have some home medications which will be placed on hold until you complete the course of blood thinner  medication.  It is important for you to complete the blood thinner medication as prescribed.  PRECAUTIONS:  If you experience chest pain or shortness of breath - call 911 immediately for transfer to the hospital emergency department.   If you develop a fever greater that 101 F, purulent drainage from wound, increased redness or drainage from wound, foul odor from the wound/dressing, or calf pain - CONTACT YOUR SURGEON.                                                   FOLLOW-UP APPOINTMENTS:  If you do not already have a post-op appointment, please call the office for an appointment to be seen by your surgeon.  Guidelines for how soon to be seen are listed in your "After Visit Summary", but are typically between 1-4 weeks after surgery.  OTHER  INSTRUCTIONS:   Knee Replacement:  Do not place pillow under knee, focus on keeping the knee straight while resting. CPM instructions: 0-90 degrees, 2 hours in the morning, 2 hours in the afternoon, and 2 hours in the evening. Place foam block, curve side up under heel at all times except when in CPM or when walking.  DO NOT modify, tear, cut, or change the foam block in any way.  MAKE SURE YOU:  Understand these instructions.  Get help right away if you are not doing well or get worse.    Thank you for letting us be a part of your medical care team.  It is a privilege we respect greatly.  We hope these instructions will help you stay on track for a fast and full recovery!   Increase activity slowly as tolerated   Complete by:  As directed       DISCHARGE MEDICATIONS:   Allergies as of 04/04/2018   No Known Allergies     Medication List    STOP taking these medications   AMOXICILLIN PO     TAKE these medications   acetaminophen 500 MG tablet Commonly known as:  TYLENOL Take 2 tablets (1,000 mg total) by mouth every 6 (six) hours.   aspirin 325 MG EC tablet Take 1 tablet (325 mg total) by mouth 2 (two) times daily.    budesonide-formoterol 160-4.5 MCG/ACT inhaler Commonly known as:  SYMBICORT Inhale 1 puff into the lungs 2 (two) times daily as needed (for shortness of breath or wheezing).   CALCIUM-MAGNESIUM PO Take 30 mLs by mouth at bedtime.   FISH OIL PO Take 1 capsule by mouth daily.   GLUCOSAMINE PO Take 1 tablet by mouth 2 (two) times a week.   methocarbamol 500 MG tablet Commonly known as:  ROBAXIN Take 1-2 tablets (500-1,000 mg total) by mouth every 6 (six) hours as needed for muscle spasms.   oxyCODONE 5 MG immediate release tablet Commonly known as:  Oxy IR/ROXICODONE Take 1-2 tablets (5-10 mg total) by mouth every 6 (six) hours as needed for moderate pain (pain score 4-6).   PROBIOTIC PO Take 1 capsule by mouth daily.   pseudoephedrine-guaifenesin 60-600 MG 12 hr tablet Commonly known as:  MUCINEX D Take 1 tablet by mouth 2 (two) times daily as needed for congestion.   simvastatin 20 MG tablet Commonly known as:  ZOCOR TAKE 1 TABLET BY MOUTH EVERY DAY IN THE EVENING What changed:    how much to take  how to take this  when to take this   SYSTANE 0.4-0.3 % Soln Generic drug:  Polyethyl Glycol-Propyl Glycol Place 1 drop into both eyes daily as needed (for dry eyes).   VITAMIN C PO Take 1 tablet by mouth daily.   Vitamin-B Complex Tabs Take 1 tablet by mouth daily.            Durable Medical Equipment  (From admission, onward)         Start     Ordered   04/03/18 1034  DME Walker rolling  Once    Question:  Patient needs a walker to treat with the following condition  Answer:  S/P total knee replacement   04/03/18 1033   04/03/18 1034  DME 3 n 1  Once     04/03/18 1033   04/03/18 1034  DME Bedside commode  Once    Question:  Patient needs a bedside commode to treat with the following condition  Answer:  S/P  total knee replacement   04/03/18 1033          FOLLOW UP VISIT:    DISPOSITION: HOME VS. SNF  CONDITION:  Good   Donia Ast 04/04/2018, 6:58 AM

## 2018-04-04 NOTE — Progress Notes (Signed)
Courtney Shaffer,PT completed the stairs with patient and patient complained of dizziness. Patients blood pressure was 70/51, 76/47. Paged [place]. Waiting on return phone call at this time.

## 2018-04-04 NOTE — Progress Notes (Signed)
Courtney [place] Robbins,PA aware of patients orthostatic hypotension episode with PT. Patients blood pressure dropped to 76/47. Patient rested in bed and rechecked BP 113/53 and patient was asymptomatic. Educated patient on orthostatic hypotension, standing up too quickly, dizziness, water intake, blood pressure ranges and to pick up a blood pressure cuff at pharmacy when picking up medications. Patient stated she took a home herbal supplement this morning, triphala, educated the patient on taking OTC herbs and supplements with prescription medications. She knows to not take triphala again until following up with her PCP. Patient verbalizes she feels comfortable going home with her husband with the education given. She feels much better since resting.

## 2018-04-04 NOTE — Progress Notes (Signed)
Physical Therapy Treatment Patient Details Name: Courtney Shaffer MRN: [id number] DOB: [birth year] Today's Date: 04/04/2018    History of Present Illness 72 yo female s/p L TKA 04/03/18    PT Comments    Progressing well with mobility. Will have a 2nd session prior to d/c later today. Plan is for d/c home with HHPT f/u.    Follow Up Recommendations  Follow surgeon's recommendation for DC plan and follow-up therapies     Equipment Recommendations  None recommended by PT    Recommendations for Other Services       Precautions / Restrictions Precautions Precautions: Fall;Knee Restrictions Weight Bearing Restrictions: No Other Position/Activity Restrictions: WBAT    Mobility  Bed Mobility Overal bed mobility: Needs Assistance Bed Mobility: Supine to Sit     Supine to sit: Supervision     General bed mobility comments: for safety  Transfers Overall transfer level: Needs assistance Equipment used: Rolling walker (2 wheeled) Transfers: Sit to/from Stand Sit to Stand: Min guard         General transfer comment: close guard for safety. VCs safety, hand/LE placement.   Ambulation/Gait Ambulation/Gait assistance: Min guard Gait Distance (Feet): 125 Feet Assistive device: Rolling walker (2 wheeled) Gait Pattern/deviations: Step-to pattern;Step-through pattern;Decreased stride length     General Gait Details: Close guard for safety. VCs safety, step length. Pt transitioned to step through pattern as distance progresseed. No c/o dizziness.    Stairs             Wheelchair Mobility    Modified Rankin (Stroke Patients Only)       Balance Overall balance assessment: Needs assistance         Standing balance support: Bilateral upper extremity supported Standing balance-Leahy Scale: Poor                              Cognition Arousal/Alertness: Awake/alert Behavior During Therapy: WFL for tasks assessed/performed Overall Cognitive  Status: Within Functional Limits for tasks assessed                                        Exercises Total Joint Exercises Ankle Circles/Pumps: AROM;Both;10 reps;Supine Quad Sets: AROM;Both;10 reps;Supine Heel Slides: AAROM;Left;10 reps;Supine Hip ABduction/ADduction: AAROM;Left;10 reps;Supine Straight Leg Raises: AAROM;Left;10 reps;Supine Knee Flexion: AAROM;Left;10 reps;Seated Goniometric ROM: ~5-95 degrees    General Comments        Pertinent Vitals/Pain Pain Assessment: 0-10 Pain Score: 4  Pain Location: L LE Pain Descriptors / Indicators: Discomfort;Sore Pain Intervention(s): Monitored during session;Repositioned;Ice applied    Home Living                      Prior Function            PT Goals (current goals can now be found in the care plan section) Progress towards PT goals: Progressing toward goals    Frequency    7X/week      PT Plan Current plan remains appropriate    Co-evaluation              AM-PAC PT "6 Clicks" Mobility   Outcome Measure  Help needed turning from your back to your side while in a flat bed without using bedrails?: A Little Help needed moving from lying on your back to sitting on the side of a flat bed without using  bedrails?: A Little Help needed moving to and from a bed to a chair (including a wheelchair)?: A Little Help needed standing up from a chair using your arms (e.g., wheelchair or bedside chair)?: A Little Help needed to walk in hospital room?: A Little Help needed climbing 3-5 steps with a railing? : A Little 6 Click Score: 18    End of Session Equipment Utilized During Treatment: Gait belt Activity Tolerance: Patient tolerated treatment well Patient left: in chair;with call bell/phone within reach   PT Visit Diagnosis: Difficulty in walking, not elsewhere classified (R26.2);Pain;Other abnormalities of gait and mobility (R26.89) Pain - Right/Left: Left Pain - part of body: Knee      Time: 9758-8325 PT Time Calculation (min) (ACUTE ONLY): 41 min  Charges:  $Gait Training: 23-37 mins $Therapeutic Exercise: 8-22 mins                        Weston Anna, PT Acute Rehabilitation Services Pager: [phone] Office: [phone]

## 2018-04-04 NOTE — Care Management Note (Signed)
Case Management Note  Patient Details  Name: Courtney Shaffer MRN: [id number] Date of Birth: 1946/01/21  Subjective/Objective:                    Action/Plan:   Expected Discharge Date:  04/04/18               Expected Discharge Plan:  [place]  In-House Referral:     Discharge planning Services  CM Consult  Post Acute Care Choice:    Choice offered to:     DME Arranged:    DME Agency:     HH Arranged:  PT [place]:  [facility] (now [facility])  Status of Service:  Completed, signed off  If discussed at [place] of Stay Meetings, dates discussed:    Additional CommentsPurcell Mouton, RN 04/04/2018, 12:10 PM

## 2018-04-06 ENCOUNTER — Encounter (HOSPITAL_COMMUNITY): Payer: Self-pay | Admitting: Orthopedic Surgery

## 2018-04-06 DIAGNOSIS — E559 Vitamin D deficiency, unspecified: Secondary | ICD-10-CM | POA: Diagnosis not present

## 2018-04-06 DIAGNOSIS — M26609 Unspecified temporomandibular joint disorder, unspecified side: Secondary | ICD-10-CM | POA: Diagnosis not present

## 2018-04-06 DIAGNOSIS — B0229 Other postherpetic nervous system involvement: Secondary | ICD-10-CM | POA: Diagnosis not present

## 2018-04-06 DIAGNOSIS — E782 Mixed hyperlipidemia: Secondary | ICD-10-CM | POA: Diagnosis not present

## 2018-04-06 DIAGNOSIS — M5136 Other intervertebral disc degeneration, lumbar region: Secondary | ICD-10-CM | POA: Diagnosis not present

## 2018-04-06 DIAGNOSIS — Z471 Aftercare following joint replacement surgery: Secondary | ICD-10-CM | POA: Diagnosis not present

## 2018-04-06 DIAGNOSIS — Z96652 Presence of left artificial knee joint: Secondary | ICD-10-CM | POA: Diagnosis not present

## 2018-04-06 DIAGNOSIS — M8580 Other specified disorders of bone density and structure, unspecified site: Secondary | ICD-10-CM | POA: Diagnosis not present

## 2018-04-06 DIAGNOSIS — Z9181 History of falling: Secondary | ICD-10-CM | POA: Diagnosis not present

## 2018-04-06 DIAGNOSIS — Z7982 Long term (current) use of aspirin: Secondary | ICD-10-CM | POA: Diagnosis not present

## 2018-04-06 DIAGNOSIS — K5730 Diverticulosis of large intestine without perforation or abscess without bleeding: Secondary | ICD-10-CM | POA: Diagnosis not present

## 2018-04-08 DIAGNOSIS — M5136 Other intervertebral disc degeneration, lumbar region: Secondary | ICD-10-CM | POA: Diagnosis not present

## 2018-04-08 DIAGNOSIS — Z96652 Presence of left artificial knee joint: Secondary | ICD-10-CM | POA: Diagnosis not present

## 2018-04-08 DIAGNOSIS — Z471 Aftercare following joint replacement surgery: Secondary | ICD-10-CM | POA: Diagnosis not present

## 2018-04-08 DIAGNOSIS — B0229 Other postherpetic nervous system involvement: Secondary | ICD-10-CM | POA: Diagnosis not present

## 2018-04-08 DIAGNOSIS — M8580 Other specified disorders of bone density and structure, unspecified site: Secondary | ICD-10-CM | POA: Diagnosis not present

## 2018-04-08 DIAGNOSIS — E559 Vitamin D deficiency, unspecified: Secondary | ICD-10-CM | POA: Diagnosis not present

## 2018-04-08 DIAGNOSIS — Z7982 Long term (current) use of aspirin: Secondary | ICD-10-CM | POA: Diagnosis not present

## 2018-04-08 DIAGNOSIS — E782 Mixed hyperlipidemia: Secondary | ICD-10-CM | POA: Diagnosis not present

## 2018-04-08 DIAGNOSIS — M26609 Unspecified temporomandibular joint disorder, unspecified side: Secondary | ICD-10-CM | POA: Diagnosis not present

## 2018-04-08 DIAGNOSIS — K5730 Diverticulosis of large intestine without perforation or abscess without bleeding: Secondary | ICD-10-CM | POA: Diagnosis not present

## 2018-04-08 DIAGNOSIS — Z9181 History of falling: Secondary | ICD-10-CM | POA: Diagnosis not present

## 2018-04-10 DIAGNOSIS — Z96652 Presence of left artificial knee joint: Secondary | ICD-10-CM | POA: Diagnosis not present

## 2018-04-10 DIAGNOSIS — R531 Weakness: Secondary | ICD-10-CM | POA: Diagnosis not present

## 2018-04-10 DIAGNOSIS — M25562 Pain in left knee: Secondary | ICD-10-CM | POA: Diagnosis not present

## 2018-04-10 DIAGNOSIS — M25662 Stiffness of left knee, not elsewhere classified: Secondary | ICD-10-CM | POA: Diagnosis not present

## 2018-04-12 DIAGNOSIS — M1712 Unilateral primary osteoarthritis, left knee: Secondary | ICD-10-CM | POA: Diagnosis not present

## 2018-04-12 DIAGNOSIS — Z96652 Presence of left artificial knee joint: Secondary | ICD-10-CM | POA: Diagnosis not present

## 2018-04-13 DIAGNOSIS — R531 Weakness: Secondary | ICD-10-CM | POA: Diagnosis not present

## 2018-04-13 DIAGNOSIS — M25662 Stiffness of left knee, not elsewhere classified: Secondary | ICD-10-CM | POA: Diagnosis not present

## 2018-04-13 DIAGNOSIS — Z96652 Presence of left artificial knee joint: Secondary | ICD-10-CM | POA: Diagnosis not present

## 2018-04-13 DIAGNOSIS — M25562 Pain in left knee: Secondary | ICD-10-CM | POA: Diagnosis not present

## 2018-04-13 DIAGNOSIS — Z471 Aftercare following joint replacement surgery: Secondary | ICD-10-CM | POA: Diagnosis not present

## 2018-04-13 DIAGNOSIS — M25462 Effusion, left knee: Secondary | ICD-10-CM | POA: Diagnosis not present

## 2018-04-14 DIAGNOSIS — R531 Weakness: Secondary | ICD-10-CM | POA: Diagnosis not present

## 2018-04-14 DIAGNOSIS — Z96652 Presence of left artificial knee joint: Secondary | ICD-10-CM | POA: Diagnosis not present

## 2018-04-14 DIAGNOSIS — M25662 Stiffness of left knee, not elsewhere classified: Secondary | ICD-10-CM | POA: Diagnosis not present

## 2018-04-14 DIAGNOSIS — M25562 Pain in left knee: Secondary | ICD-10-CM | POA: Diagnosis not present

## 2018-04-17 DIAGNOSIS — Z96652 Presence of left artificial knee joint: Secondary | ICD-10-CM | POA: Diagnosis not present

## 2018-04-17 DIAGNOSIS — M25562 Pain in left knee: Secondary | ICD-10-CM | POA: Diagnosis not present

## 2018-04-17 DIAGNOSIS — R531 Weakness: Secondary | ICD-10-CM | POA: Diagnosis not present

## 2018-04-17 DIAGNOSIS — M25662 Stiffness of left knee, not elsewhere classified: Secondary | ICD-10-CM | POA: Diagnosis not present

## 2018-04-19 DIAGNOSIS — M25662 Stiffness of left knee, not elsewhere classified: Secondary | ICD-10-CM | POA: Diagnosis not present

## 2018-04-19 DIAGNOSIS — M25562 Pain in left knee: Secondary | ICD-10-CM | POA: Diagnosis not present

## 2018-04-19 DIAGNOSIS — R531 Weakness: Secondary | ICD-10-CM | POA: Diagnosis not present

## 2018-04-19 DIAGNOSIS — Z96652 Presence of left artificial knee joint: Secondary | ICD-10-CM | POA: Diagnosis not present

## 2018-04-21 DIAGNOSIS — R531 Weakness: Secondary | ICD-10-CM | POA: Diagnosis not present

## 2018-04-21 DIAGNOSIS — M25562 Pain in left knee: Secondary | ICD-10-CM | POA: Diagnosis not present

## 2018-04-21 DIAGNOSIS — Z96652 Presence of left artificial knee joint: Secondary | ICD-10-CM | POA: Diagnosis not present

## 2018-04-21 DIAGNOSIS — M25662 Stiffness of left knee, not elsewhere classified: Secondary | ICD-10-CM | POA: Diagnosis not present

## 2018-04-24 DIAGNOSIS — Z96652 Presence of left artificial knee joint: Secondary | ICD-10-CM | POA: Diagnosis not present

## 2018-04-24 DIAGNOSIS — M25562 Pain in left knee: Secondary | ICD-10-CM | POA: Diagnosis not present

## 2018-04-24 DIAGNOSIS — M25662 Stiffness of left knee, not elsewhere classified: Secondary | ICD-10-CM | POA: Diagnosis not present

## 2018-04-24 DIAGNOSIS — R531 Weakness: Secondary | ICD-10-CM | POA: Diagnosis not present

## 2018-04-26 DIAGNOSIS — Z96652 Presence of left artificial knee joint: Secondary | ICD-10-CM | POA: Diagnosis not present

## 2018-04-26 DIAGNOSIS — R531 Weakness: Secondary | ICD-10-CM | POA: Diagnosis not present

## 2018-04-26 DIAGNOSIS — M25562 Pain in left knee: Secondary | ICD-10-CM | POA: Diagnosis not present

## 2018-04-26 DIAGNOSIS — M25662 Stiffness of left knee, not elsewhere classified: Secondary | ICD-10-CM | POA: Diagnosis not present

## 2018-04-28 DIAGNOSIS — M25662 Stiffness of left knee, not elsewhere classified: Secondary | ICD-10-CM | POA: Diagnosis not present

## 2018-04-28 DIAGNOSIS — R531 Weakness: Secondary | ICD-10-CM | POA: Diagnosis not present

## 2018-04-28 DIAGNOSIS — M25562 Pain in left knee: Secondary | ICD-10-CM | POA: Diagnosis not present

## 2018-04-28 DIAGNOSIS — Z96652 Presence of left artificial knee joint: Secondary | ICD-10-CM | POA: Diagnosis not present

## 2018-05-01 DIAGNOSIS — M25562 Pain in left knee: Secondary | ICD-10-CM | POA: Diagnosis not present

## 2018-05-01 DIAGNOSIS — M25662 Stiffness of left knee, not elsewhere classified: Secondary | ICD-10-CM | POA: Diagnosis not present

## 2018-05-01 DIAGNOSIS — Z96652 Presence of left artificial knee joint: Secondary | ICD-10-CM | POA: Diagnosis not present

## 2018-05-01 DIAGNOSIS — R531 Weakness: Secondary | ICD-10-CM | POA: Diagnosis not present

## 2018-05-03 DIAGNOSIS — R531 Weakness: Secondary | ICD-10-CM | POA: Diagnosis not present

## 2018-05-03 DIAGNOSIS — M25562 Pain in left knee: Secondary | ICD-10-CM | POA: Diagnosis not present

## 2018-05-03 DIAGNOSIS — Z96652 Presence of left artificial knee joint: Secondary | ICD-10-CM | POA: Diagnosis not present

## 2018-05-03 DIAGNOSIS — M25662 Stiffness of left knee, not elsewhere classified: Secondary | ICD-10-CM | POA: Diagnosis not present

## 2018-05-05 DIAGNOSIS — M25662 Stiffness of left knee, not elsewhere classified: Secondary | ICD-10-CM | POA: Diagnosis not present

## 2018-05-05 DIAGNOSIS — Z96652 Presence of left artificial knee joint: Secondary | ICD-10-CM | POA: Diagnosis not present

## 2018-05-05 DIAGNOSIS — R531 Weakness: Secondary | ICD-10-CM | POA: Diagnosis not present

## 2018-05-05 DIAGNOSIS — M25562 Pain in left knee: Secondary | ICD-10-CM | POA: Diagnosis not present

## 2018-05-08 DIAGNOSIS — R531 Weakness: Secondary | ICD-10-CM | POA: Diagnosis not present

## 2018-05-08 DIAGNOSIS — M25562 Pain in left knee: Secondary | ICD-10-CM | POA: Diagnosis not present

## 2018-05-08 DIAGNOSIS — Z96652 Presence of left artificial knee joint: Secondary | ICD-10-CM | POA: Diagnosis not present

## 2018-05-08 DIAGNOSIS — M25662 Stiffness of left knee, not elsewhere classified: Secondary | ICD-10-CM | POA: Diagnosis not present

## 2018-05-10 DIAGNOSIS — M25662 Stiffness of left knee, not elsewhere classified: Secondary | ICD-10-CM | POA: Diagnosis not present

## 2018-05-10 DIAGNOSIS — R531 Weakness: Secondary | ICD-10-CM | POA: Diagnosis not present

## 2018-05-10 DIAGNOSIS — M25562 Pain in left knee: Secondary | ICD-10-CM | POA: Diagnosis not present

## 2018-05-10 DIAGNOSIS — Z96652 Presence of left artificial knee joint: Secondary | ICD-10-CM | POA: Diagnosis not present

## 2018-05-12 DIAGNOSIS — M25662 Stiffness of left knee, not elsewhere classified: Secondary | ICD-10-CM | POA: Diagnosis not present

## 2018-05-12 DIAGNOSIS — M25562 Pain in left knee: Secondary | ICD-10-CM | POA: Diagnosis not present

## 2018-05-12 DIAGNOSIS — Z96652 Presence of left artificial knee joint: Secondary | ICD-10-CM | POA: Diagnosis not present

## 2018-05-12 DIAGNOSIS — R531 Weakness: Secondary | ICD-10-CM | POA: Diagnosis not present

## 2018-05-15 DIAGNOSIS — M25662 Stiffness of left knee, not elsewhere classified: Secondary | ICD-10-CM | POA: Diagnosis not present

## 2018-05-15 DIAGNOSIS — Z96652 Presence of left artificial knee joint: Secondary | ICD-10-CM | POA: Diagnosis not present

## 2018-05-15 DIAGNOSIS — R531 Weakness: Secondary | ICD-10-CM | POA: Diagnosis not present

## 2018-05-15 DIAGNOSIS — M25562 Pain in left knee: Secondary | ICD-10-CM | POA: Diagnosis not present

## 2018-05-17 DIAGNOSIS — R531 Weakness: Secondary | ICD-10-CM | POA: Diagnosis not present

## 2018-05-17 DIAGNOSIS — M25562 Pain in left knee: Secondary | ICD-10-CM | POA: Diagnosis not present

## 2018-05-17 DIAGNOSIS — Z96652 Presence of left artificial knee joint: Secondary | ICD-10-CM | POA: Diagnosis not present

## 2018-05-17 DIAGNOSIS — M25662 Stiffness of left knee, not elsewhere classified: Secondary | ICD-10-CM | POA: Diagnosis not present

## 2018-05-18 DIAGNOSIS — F39 Unspecified mood [affective] disorder: Secondary | ICD-10-CM | POA: Diagnosis not present

## 2018-05-18 DIAGNOSIS — M179 Osteoarthritis of knee, unspecified: Secondary | ICD-10-CM | POA: Diagnosis not present

## 2018-05-19 DIAGNOSIS — R531 Weakness: Secondary | ICD-10-CM | POA: Diagnosis not present

## 2018-05-19 DIAGNOSIS — Z96652 Presence of left artificial knee joint: Secondary | ICD-10-CM | POA: Diagnosis not present

## 2018-05-19 DIAGNOSIS — M25562 Pain in left knee: Secondary | ICD-10-CM | POA: Diagnosis not present

## 2018-05-19 DIAGNOSIS — M25662 Stiffness of left knee, not elsewhere classified: Secondary | ICD-10-CM | POA: Diagnosis not present

## 2018-05-22 DIAGNOSIS — D229 Melanocytic nevi, unspecified: Secondary | ICD-10-CM | POA: Diagnosis not present

## 2018-05-22 DIAGNOSIS — M25662 Stiffness of left knee, not elsewhere classified: Secondary | ICD-10-CM | POA: Diagnosis not present

## 2018-05-22 DIAGNOSIS — M25562 Pain in left knee: Secondary | ICD-10-CM | POA: Diagnosis not present

## 2018-05-22 DIAGNOSIS — R531 Weakness: Secondary | ICD-10-CM | POA: Diagnosis not present

## 2018-05-22 DIAGNOSIS — Z96652 Presence of left artificial knee joint: Secondary | ICD-10-CM | POA: Diagnosis not present

## 2018-05-22 DIAGNOSIS — B079 Viral wart, unspecified: Secondary | ICD-10-CM | POA: Diagnosis not present

## 2018-05-24 DIAGNOSIS — M25562 Pain in left knee: Secondary | ICD-10-CM | POA: Diagnosis not present

## 2018-05-24 DIAGNOSIS — M25662 Stiffness of left knee, not elsewhere classified: Secondary | ICD-10-CM | POA: Diagnosis not present

## 2018-05-24 DIAGNOSIS — Z96652 Presence of left artificial knee joint: Secondary | ICD-10-CM | POA: Diagnosis not present

## 2018-05-24 DIAGNOSIS — R531 Weakness: Secondary | ICD-10-CM | POA: Diagnosis not present

## 2018-05-26 DIAGNOSIS — R531 Weakness: Secondary | ICD-10-CM | POA: Diagnosis not present

## 2018-05-26 DIAGNOSIS — M25662 Stiffness of left knee, not elsewhere classified: Secondary | ICD-10-CM | POA: Diagnosis not present

## 2018-05-26 DIAGNOSIS — Z96652 Presence of left artificial knee joint: Secondary | ICD-10-CM | POA: Diagnosis not present

## 2018-05-26 DIAGNOSIS — M25562 Pain in left knee: Secondary | ICD-10-CM | POA: Diagnosis not present

## 2018-05-29 DIAGNOSIS — R531 Weakness: Secondary | ICD-10-CM | POA: Diagnosis not present

## 2018-05-29 DIAGNOSIS — M25562 Pain in left knee: Secondary | ICD-10-CM | POA: Diagnosis not present

## 2018-05-29 DIAGNOSIS — M25662 Stiffness of left knee, not elsewhere classified: Secondary | ICD-10-CM | POA: Diagnosis not present

## 2018-05-29 DIAGNOSIS — Z96652 Presence of left artificial knee joint: Secondary | ICD-10-CM | POA: Diagnosis not present

## 2018-05-31 DIAGNOSIS — M25662 Stiffness of left knee, not elsewhere classified: Secondary | ICD-10-CM | POA: Diagnosis not present

## 2018-05-31 DIAGNOSIS — Z96652 Presence of left artificial knee joint: Secondary | ICD-10-CM | POA: Diagnosis not present

## 2018-05-31 DIAGNOSIS — M25562 Pain in left knee: Secondary | ICD-10-CM | POA: Diagnosis not present

## 2018-05-31 DIAGNOSIS — R531 Weakness: Secondary | ICD-10-CM | POA: Diagnosis not present

## 2018-06-02 DIAGNOSIS — Z96652 Presence of left artificial knee joint: Secondary | ICD-10-CM | POA: Diagnosis not present

## 2018-06-02 DIAGNOSIS — M25662 Stiffness of left knee, not elsewhere classified: Secondary | ICD-10-CM | POA: Diagnosis not present

## 2018-06-02 DIAGNOSIS — M25562 Pain in left knee: Secondary | ICD-10-CM | POA: Diagnosis not present

## 2018-06-02 DIAGNOSIS — R531 Weakness: Secondary | ICD-10-CM | POA: Diagnosis not present

## 2018-06-05 DIAGNOSIS — M25562 Pain in left knee: Secondary | ICD-10-CM | POA: Diagnosis not present

## 2018-06-05 DIAGNOSIS — M25662 Stiffness of left knee, not elsewhere classified: Secondary | ICD-10-CM | POA: Diagnosis not present

## 2018-06-05 DIAGNOSIS — R531 Weakness: Secondary | ICD-10-CM | POA: Diagnosis not present

## 2018-06-05 DIAGNOSIS — Z96652 Presence of left artificial knee joint: Secondary | ICD-10-CM | POA: Diagnosis not present

## 2018-06-08 DIAGNOSIS — M25662 Stiffness of left knee, not elsewhere classified: Secondary | ICD-10-CM | POA: Diagnosis not present

## 2018-06-08 DIAGNOSIS — M25562 Pain in left knee: Secondary | ICD-10-CM | POA: Diagnosis not present

## 2018-06-08 DIAGNOSIS — Z96652 Presence of left artificial knee joint: Secondary | ICD-10-CM | POA: Diagnosis not present

## 2018-06-08 DIAGNOSIS — R531 Weakness: Secondary | ICD-10-CM | POA: Diagnosis not present

## 2018-06-12 DIAGNOSIS — M25662 Stiffness of left knee, not elsewhere classified: Secondary | ICD-10-CM | POA: Diagnosis not present

## 2018-06-12 DIAGNOSIS — R531 Weakness: Secondary | ICD-10-CM | POA: Diagnosis not present

## 2018-06-12 DIAGNOSIS — M25562 Pain in left knee: Secondary | ICD-10-CM | POA: Diagnosis not present

## 2018-06-12 DIAGNOSIS — Z96652 Presence of left artificial knee joint: Secondary | ICD-10-CM | POA: Diagnosis not present

## 2018-06-14 DIAGNOSIS — R531 Weakness: Secondary | ICD-10-CM | POA: Diagnosis not present

## 2018-06-14 DIAGNOSIS — Z96652 Presence of left artificial knee joint: Secondary | ICD-10-CM | POA: Diagnosis not present

## 2018-06-14 DIAGNOSIS — M25662 Stiffness of left knee, not elsewhere classified: Secondary | ICD-10-CM | POA: Diagnosis not present

## 2018-06-14 DIAGNOSIS — M25562 Pain in left knee: Secondary | ICD-10-CM | POA: Diagnosis not present

## 2018-06-19 DIAGNOSIS — M25662 Stiffness of left knee, not elsewhere classified: Secondary | ICD-10-CM | POA: Diagnosis not present

## 2018-06-19 DIAGNOSIS — Z96652 Presence of left artificial knee joint: Secondary | ICD-10-CM | POA: Diagnosis not present

## 2018-06-19 DIAGNOSIS — M25562 Pain in left knee: Secondary | ICD-10-CM | POA: Diagnosis not present

## 2018-06-19 DIAGNOSIS — R531 Weakness: Secondary | ICD-10-CM | POA: Diagnosis not present

## 2018-06-21 DIAGNOSIS — M25662 Stiffness of left knee, not elsewhere classified: Secondary | ICD-10-CM | POA: Diagnosis not present

## 2018-06-21 DIAGNOSIS — R531 Weakness: Secondary | ICD-10-CM | POA: Diagnosis not present

## 2018-06-21 DIAGNOSIS — Z96652 Presence of left artificial knee joint: Secondary | ICD-10-CM | POA: Diagnosis not present

## 2018-06-21 DIAGNOSIS — M25562 Pain in left knee: Secondary | ICD-10-CM | POA: Diagnosis not present

## 2018-06-26 DIAGNOSIS — M25561 Pain in right knee: Secondary | ICD-10-CM | POA: Diagnosis not present

## 2018-06-29 IMAGING — MR MR [PERSON_NAME]
10 series · 16 of 16 positions shown · non-contrast
Comparison: 03/01/2016 MR temporomandibular joints

03/07/2017 CT maxillofacial bones

CLINICAL DATA: Bilateral jaw pain.

EXAM:
MRI OF TEMPOROMANDIBULAR JOINT WITHOUT CONTRAST
TECHNIQUE: Multiplanar, multisequence MR imaging of the temporomandibular joint
was performed following the standard protocol. No intravenous
contrast was administered.

[Series 4: T1 · coronal · 3.0mm · 0.39mm/px · 1 of 11 slices shown (1 of 3)]
[im 1/11]
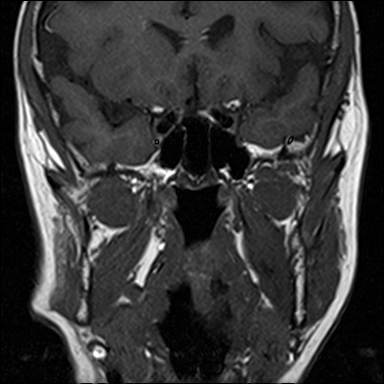

[Series 5: T1 · axial · 3.0mm · 0.39mm/px · 1 of 13 slices shown (2 of 3)]
[im 1/13]
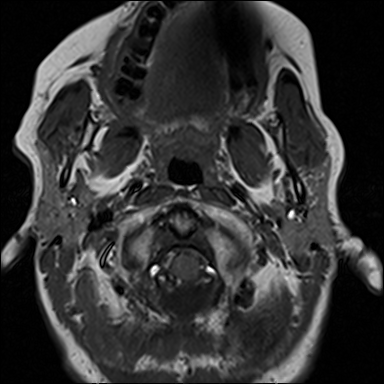

[Series 6: T1 · sagittal · 4.0mm · 0.36mm/px · 1 of 16 slices shown (3 of 3)]
[im 1/16]
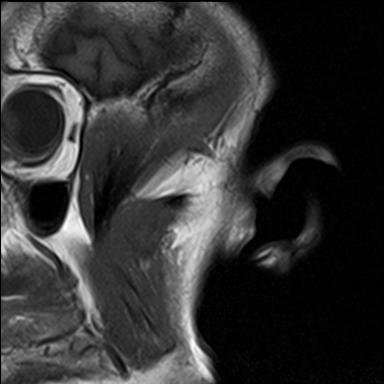

[Series 7: GRE · sagittal · 4.0mm · 0.34mm/px · 1 of 16 slices shown (1 of 7)]
[im 1/16]
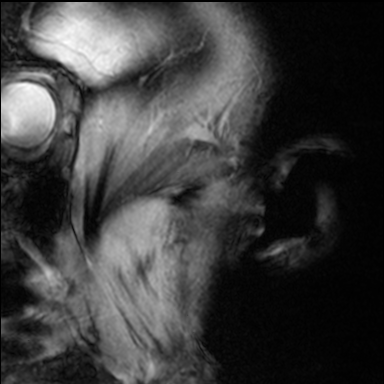

[Series 8: GRE · sagittal · 4.0mm · 0.34mm/px · 2 of 16 slices shown (2 of 7)]
[im 1/16]
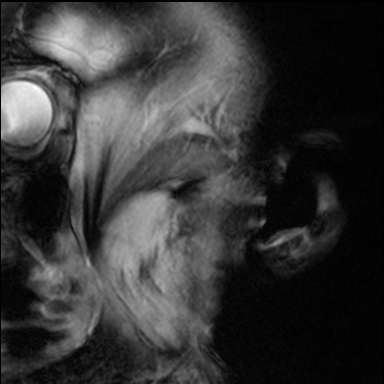
[im 16/16]
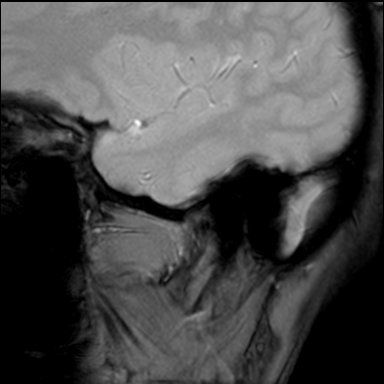

[Series 9: GRE · sagittal · 4.0mm · 0.34mm/px · 2 of 16 slices shown (3 of 7)]
[im 1/16]
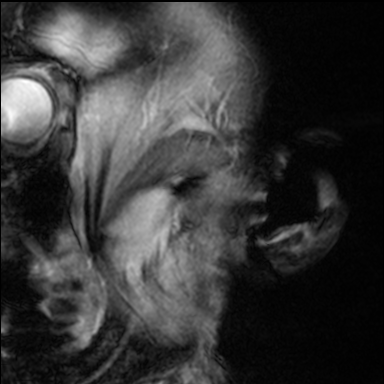
[im 16/16]
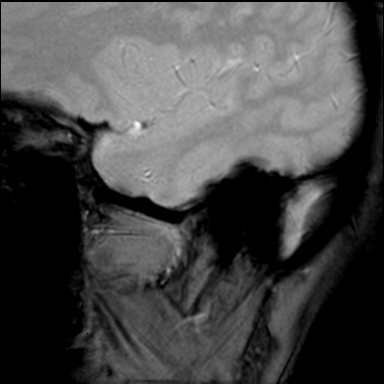

[Series 10: GRE · sagittal · 4.0mm · 0.34mm/px · 2 of 16 slices shown (4 of 7)]
[im 1/16]
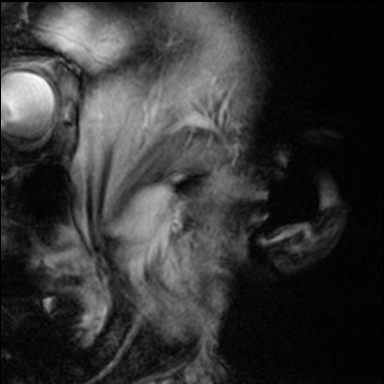
[im 16/16]
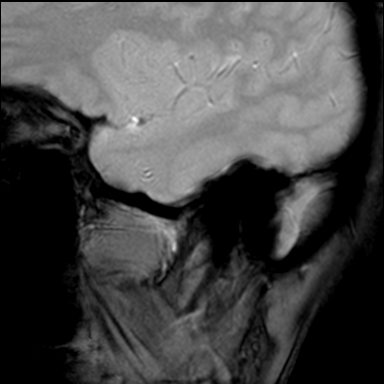

[Series 11: GRE · sagittal · 4.0mm · 0.34mm/px · 2 of 16 slices shown (5 of 7)]
[im 1/16]
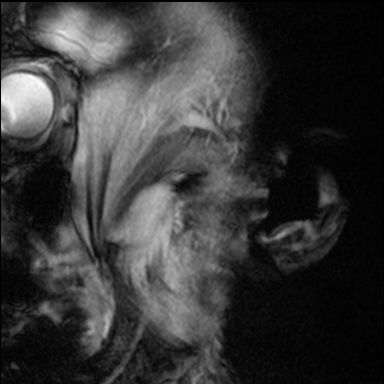
[im 16/16]
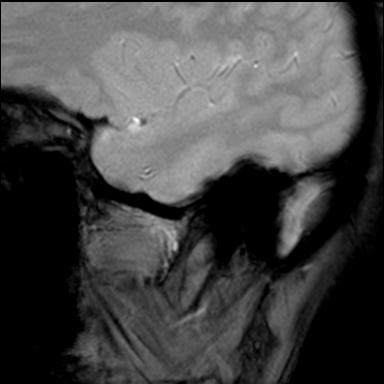

[Series 12: GRE · sagittal · 4.0mm · 0.34mm/px · 2 of 16 slices shown (6 of 7)]
[im 1/16]
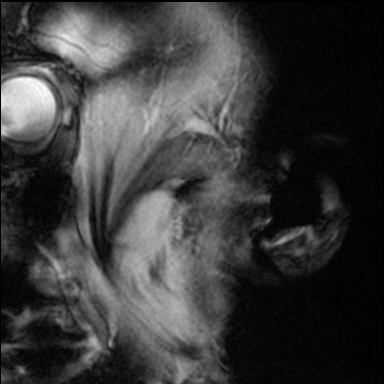
[im 16/16]
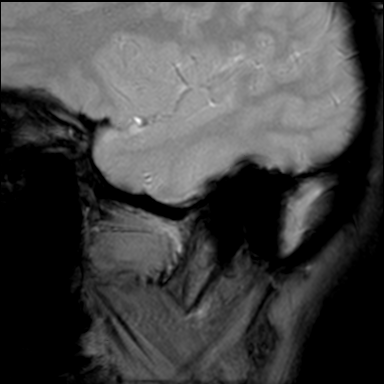

[Series 13: GRE · sagittal · 4.0mm · 0.34mm/px · 2 of 16 slices shown (7 of 7)]
[im 1/16]
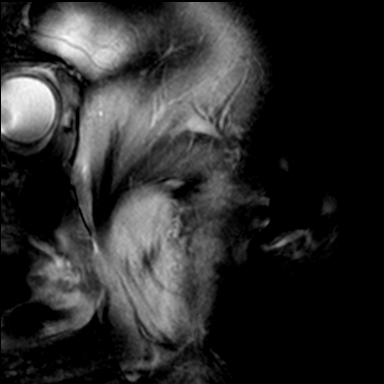
[im 16/16]
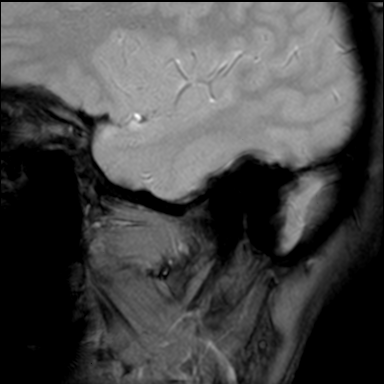

[16 of 16 positions shown; findings below may reference images not displayed]

FINDINGS: Right temporomandibular joint: There is severe maceration of the
articular disc.There is normal anterior translation of the
mandibular condyle with jaw opening. There is a small joint effusion
with synovitis. There is flattening of the articular surface of the
right mandibular condyles with marginal osteophytes and subchondral
reactive marrow changes. There is remodeling of the glenoid fossa
which is new compared with the prior exam.

Left temporomandibular joint: The articular disc is normally
positioned between the mandibular condyle and the temporal bone in
both open and closed positions. There is normal anterior translation
of the mandibular condyle with jaw opening.There is no joint
effusion.

Other: No aggressive osseous lesion.
IMPRESSION: 1. Progressive severe osteoarthritis of the right temporomandibular
joint with maceration of the articular disc. There has been rapid
progression compared with 03/01/2016. This may reflect possible
underlying avascular necrosis given the flattening of the mandibular
condyle and remodeling of the glenoid fossa versus an inflammatory
arthropathy such as rheumatoid arthritis.

## 2018-08-28 DIAGNOSIS — L570 Actinic keratosis: Secondary | ICD-10-CM | POA: Diagnosis not present

## 2018-08-28 DIAGNOSIS — B070 Plantar wart: Secondary | ICD-10-CM | POA: Diagnosis not present

## 2018-08-30 DIAGNOSIS — Z1231 Encounter for screening mammogram for malignant neoplasm of breast: Secondary | ICD-10-CM | POA: Diagnosis not present

## 2018-09-01 DIAGNOSIS — Z96652 Presence of left artificial knee joint: Secondary | ICD-10-CM | POA: Diagnosis not present

## 2018-09-01 DIAGNOSIS — M25662 Stiffness of left knee, not elsewhere classified: Secondary | ICD-10-CM | POA: Diagnosis not present

## 2018-09-01 DIAGNOSIS — M25562 Pain in left knee: Secondary | ICD-10-CM | POA: Diagnosis not present

## 2018-09-01 DIAGNOSIS — R531 Weakness: Secondary | ICD-10-CM | POA: Diagnosis not present

## 2018-09-11 DIAGNOSIS — M25662 Stiffness of left knee, not elsewhere classified: Secondary | ICD-10-CM | POA: Diagnosis not present

## 2018-09-11 DIAGNOSIS — M25562 Pain in left knee: Secondary | ICD-10-CM | POA: Diagnosis not present

## 2018-09-11 DIAGNOSIS — R531 Weakness: Secondary | ICD-10-CM | POA: Diagnosis not present

## 2018-09-11 DIAGNOSIS — Z96652 Presence of left artificial knee joint: Secondary | ICD-10-CM | POA: Diagnosis not present

## 2018-09-20 DIAGNOSIS — R531 Weakness: Secondary | ICD-10-CM | POA: Diagnosis not present

## 2018-09-20 DIAGNOSIS — M25662 Stiffness of left knee, not elsewhere classified: Secondary | ICD-10-CM | POA: Diagnosis not present

## 2018-09-20 DIAGNOSIS — Z96652 Presence of left artificial knee joint: Secondary | ICD-10-CM | POA: Diagnosis not present

## 2018-09-20 DIAGNOSIS — M25562 Pain in left knee: Secondary | ICD-10-CM | POA: Diagnosis not present

## 2018-09-26 DIAGNOSIS — Z96652 Presence of left artificial knee joint: Secondary | ICD-10-CM | POA: Diagnosis not present

## 2018-10-19 DIAGNOSIS — F39 Unspecified mood [affective] disorder: Secondary | ICD-10-CM | POA: Diagnosis not present

## 2018-10-19 DIAGNOSIS — M179 Osteoarthritis of knee, unspecified: Secondary | ICD-10-CM | POA: Diagnosis not present

## 2018-10-19 DIAGNOSIS — R7309 Other abnormal glucose: Secondary | ICD-10-CM | POA: Diagnosis not present

## 2018-10-19 DIAGNOSIS — E871 Hypo-osmolality and hyponatremia: Secondary | ICD-10-CM | POA: Diagnosis not present

## 2018-10-19 DIAGNOSIS — E785 Hyperlipidemia, unspecified: Secondary | ICD-10-CM | POA: Diagnosis not present

## 2018-10-31 DIAGNOSIS — R531 Weakness: Secondary | ICD-10-CM | POA: Diagnosis not present

## 2018-10-31 DIAGNOSIS — M25562 Pain in left knee: Secondary | ICD-10-CM | POA: Diagnosis not present

## 2018-10-31 DIAGNOSIS — M25662 Stiffness of left knee, not elsewhere classified: Secondary | ICD-10-CM | POA: Diagnosis not present

## 2018-10-31 DIAGNOSIS — Z96652 Presence of left artificial knee joint: Secondary | ICD-10-CM | POA: Diagnosis not present

## 2018-11-20 ENCOUNTER — Other Ambulatory Visit: Payer: Self-pay | Admitting: Orthopedic Surgery

## 2018-11-21 DIAGNOSIS — H25811 Combined forms of age-related cataract, right eye: Secondary | ICD-10-CM | POA: Diagnosis not present

## 2018-11-21 DIAGNOSIS — H2511 Age-related nuclear cataract, right eye: Secondary | ICD-10-CM | POA: Diagnosis not present

## 2018-11-21 DIAGNOSIS — H25011 Cortical age-related cataract, right eye: Secondary | ICD-10-CM | POA: Diagnosis not present

## 2018-12-08 DIAGNOSIS — R531 Weakness: Secondary | ICD-10-CM | POA: Diagnosis not present

## 2018-12-08 DIAGNOSIS — M25562 Pain in left knee: Secondary | ICD-10-CM | POA: Diagnosis not present

## 2018-12-08 DIAGNOSIS — M25662 Stiffness of left knee, not elsewhere classified: Secondary | ICD-10-CM | POA: Diagnosis not present

## 2018-12-08 DIAGNOSIS — Z96652 Presence of left artificial knee joint: Secondary | ICD-10-CM | POA: Diagnosis not present

## 2018-12-12 DIAGNOSIS — H25012 Cortical age-related cataract, left eye: Secondary | ICD-10-CM | POA: Diagnosis not present

## 2018-12-12 DIAGNOSIS — H2512 Age-related nuclear cataract, left eye: Secondary | ICD-10-CM | POA: Diagnosis not present

## 2018-12-12 DIAGNOSIS — H25812 Combined forms of age-related cataract, left eye: Secondary | ICD-10-CM | POA: Diagnosis not present

## 2018-12-19 DIAGNOSIS — Z01419 Encounter for gynecological examination (general) (routine) without abnormal findings: Secondary | ICD-10-CM | POA: Diagnosis not present

## 2019-01-26 DIAGNOSIS — R531 Weakness: Secondary | ICD-10-CM | POA: Diagnosis not present

## 2019-01-26 DIAGNOSIS — M25662 Stiffness of left knee, not elsewhere classified: Secondary | ICD-10-CM | POA: Diagnosis not present

## 2019-01-26 DIAGNOSIS — Z96652 Presence of left artificial knee joint: Secondary | ICD-10-CM | POA: Diagnosis not present

## 2019-01-26 DIAGNOSIS — R269 Unspecified abnormalities of gait and mobility: Secondary | ICD-10-CM | POA: Diagnosis not present

## 2019-01-31 DIAGNOSIS — M25662 Stiffness of left knee, not elsewhere classified: Secondary | ICD-10-CM | POA: Diagnosis not present

## 2019-01-31 DIAGNOSIS — R269 Unspecified abnormalities of gait and mobility: Secondary | ICD-10-CM | POA: Diagnosis not present

## 2019-01-31 DIAGNOSIS — Z96652 Presence of left artificial knee joint: Secondary | ICD-10-CM | POA: Diagnosis not present

## 2019-01-31 DIAGNOSIS — R531 Weakness: Secondary | ICD-10-CM | POA: Diagnosis not present

## 2019-02-05 NOTE — Patient Instructions (Addendum)
DUE TO COVID-19 ONLY ONE VISITOR IS ALLOWED TO COME WITH YOU AND STAY IN THE WAITING ROOM ONLY DURING PRE OP AND PROCEDURE DAY OF SURGERY. THE 1 VISITOR MAY VISIT WITH YOU AFTER SURGERY IN YOUR PRIVATE ROOM DURING VISITING HOURS ONLY!  YOU NEED TO HAVE A COVID 19 TEST ON__10/29_____ @_9 :05______, THIS TEST MUST BE DONE BEFORE SURGERY, COME  [address], [place] , [address].  ([place]) ONCE YOUR COVID TEST IS COMPLETED, PLEASE BEGIN THE QUARANTINE INSTRUCTIONS AS OUTLINED IN YOUR HANDOUT.                Courtney Shaffer    Your procedure is scheduled on: 02/12/19   Report to [facility] Main  Entrance   Report to Short Stay at 5:30  AM     Call this number if you have problems the morning of surgery [place], NO CHEWING GUM [place].   Do not eat food After Midnight.   YOU MAY HAVE CLEAR LIQUIDS FROM MIDNIGHT UNTIL 4:30AM  . At 4:30AM Please finish the prescribed Pre-Surgery  drink  . Nothing by mouth after you finish the  drink !   Take these medicines the morning of surgery with A SIP OF WATER: none                                 You may not have any metal on your body including hair pins and              piercings             Do not wear jewelry, make-up, lotions, powders or perfumes, deodorant             Do not wear nail polish on your fingernails.  Do not shave  48 hours prior to surgery.          Do not bring valuables to the hospital. [place].  Contacts, dentures or bridgework may not be worn into surgery.       Name and phone number of your driver:  Special Instructions: N/A              Please read over the following fact sheets you were given: _____________________________________________________________________             [facility] - Preparing for Surgery  Before surgery, you can play an  important role.   Because skin is not sterile, your skin needs to be as free of germs as possible.   You can reduce the number of germs on your skin by washing with CHG (chlorahexidine gluconate) soap before surgery.   CHG is an antiseptic cleaner which kills germs and bonds with the skin to continue killing germs even after washing. Please DO NOT use if you have an allergy to CHG or antibacterial soaps.   If your skin becomes reddened/irritated stop using the CHG and inform your nurse when you arrive at Short Stay. Do not shave (including legs and underarms) for at least 48 hours prior to the first CHG shower.  Please follow these instructions carefully:  1.  Shower with CHG Soap the night before surgery and the  morning of Surgery.  2.  If you choose to wash your hair, wash your hair first as usual with your  normal  shampoo.  3.  After you shampoo, rinse your hair and body thoroughly to remove the  shampoo.                                        4.  Use CHG as you would any other liquid soap.  You can apply chg directly  to the skin and wash                       Gently with a scrungie or clean washcloth.  5.  Apply the CHG Soap to your body ONLY FROM THE NECK DOWN.   Do not use on face/ open                           Wound or open sores. Avoid contact with eyes, ears mouth and genitals (private parts).                       Wash face,  Genitals (private parts) with your normal soap.             6.  Wash thoroughly, paying special attention to the area where your surgery  will be performed.  7.  Thoroughly rinse your body with warm water from the neck down.  8.  DO NOT shower/wash with your normal soap after using and rinsing off  the CHG Soap.            9.  Pat yourself dry with a clean towel.            10.  Wear clean pajamas.            11.  Place clean sheets on your bed the night of your first shower and do not  sleep with pets. Day of Surgery : Do not apply any lotions/deodorants  the morning of surgery.  Please wear clean clothes to the hospital/surgery center.  FAILURE TO FOLLOW THESE INSTRUCTIONS MAY RESULT IN THE CANCELLATION OF YOUR SURGERY PATIENT SIGNATURE_________________________________  NURSE SIGNATURE__________________________________  ________________________________________________________________________   Courtney Shaffer  An incentive spirometer is a tool that can help keep your lungs clear and active. This tool measures how well you are filling your lungs with each breath. Taking long deep breaths may help reverse or decrease the chance of developing breathing (pulmonary) problems (especially infection) following:  A long period of time when you are unable to move or be active. BEFORE THE PROCEDURE   If the spirometer includes an indicator to show your best effort, your nurse or respiratory therapist will set it to a desired goal.  If possible, sit up straight or lean slightly forward. Try not to slouch.  Hold the incentive spirometer in an upright position. INSTRUCTIONS FOR USE  1. Sit on the edge of your bed if possible, or sit up as far as you can in bed or on a chair. 2. Hold the incentive spirometer in an upright position. 3. Breathe out normally. 4. Place the mouthpiece in your mouth and seal your lips tightly around it. 5. Breathe in slowly and as deeply as possible, raising the piston or the ball toward the top of the column. 6. Hold your breath for 3-5 seconds or for as  long as possible. Allow the piston or ball to fall to the bottom of the column. 7. Remove the mouthpiece from your mouth and breathe out normally. 8. Rest for a few seconds and repeat Steps 1 through 7 at least 10 times every 1-2 hours when you are awake. Take your time and take a few normal breaths between deep breaths. 9. The spirometer may include an indicator to show your best effort. Use the indicator as a goal to work toward during each repetition. 10. After  each set of 10 deep breaths, practice coughing to be sure your lungs are clear. If you have an incision (the cut made at the time of surgery), support your incision when coughing by placing a pillow or rolled up towels firmly against it. Once you are able to get out of bed, walk around indoors and cough well. You may stop using the incentive spirometer when instructed by your caregiver.  RISKS AND COMPLICATIONS  Take your time so you do not get dizzy or light-headed.  If you are in pain, you may need to take or ask for pain medication before doing incentive spirometry. It is harder to take a deep breath if you are having pain. AFTER USE  Rest and breathe slowly and easily.  It can be helpful to keep track of a log of your progress. Your caregiver can provide you with a simple table to help with this. If you are using the spirometer at home, follow these instructions: [employer] IF:   You are having difficultly using the spirometer.  You have trouble using the spirometer as often as instructed.  Your pain medication is not giving enough relief while using the spirometer.  You develop fever of 100.5 F (38.1 C) or higher. SEEK IMMEDIATE MEDICAL CARE IF:   You cough up bloody sputum that had not been present before.  You develop fever of 102 F (38.9 C) or greater.  You develop worsening pain at or near the incision site. MAKE SURE YOU:   Understand these instructions.  Will watch your condition.  Will get help right away if you are not doing well or get worse. Document Released: 08/09/2006 Document Revised: 06/21/2011 Document Reviewed: 10/10/2006 [facility] Patient Information 2014 [place], [place].   ________________________________________________________________________

## 2019-02-06 ENCOUNTER — Encounter (HOSPITAL_COMMUNITY)
Admission: RE | Admit: 2019-02-06 | Discharge: 2019-02-06 | Disposition: A | Discharge status: Home / Self Care | Payer: PPO | Source: Ambulatory Visit | Attending: Orthopedic Surgery | Admitting: Orthopedic Surgery

## 2019-02-06 ENCOUNTER — Encounter (HOSPITAL_COMMUNITY): Payer: Self-pay

## 2019-02-06 ENCOUNTER — Other Ambulatory Visit: Payer: Self-pay

## 2019-02-06 DIAGNOSIS — M1991 Primary osteoarthritis, unspecified site: Secondary | ICD-10-CM | POA: Insufficient documentation

## 2019-02-06 DIAGNOSIS — Z01812 Encounter for preprocedural laboratory examination: Secondary | ICD-10-CM | POA: Diagnosis not present

## 2019-02-06 LAB — CBC WITH DIFFERENTIAL/PLATELET
Abs Immature Granulocytes: 0.01 10*3/uL (ref 0.00–0.07)
Basophils Absolute: 0 10*3/uL (ref 0.0–0.1)
Basophils Relative: 1 %
Eosinophils Absolute: 0.2 10*3/uL (ref 0.0–0.5)
Eosinophils Relative: 4 %
HCT: 40.6 % (ref 36.0–46.0)
Hemoglobin: 13.4 g/dL (ref 12.0–15.0)
Immature Granulocytes: 0 %
Lymphocytes Relative: 23 %
Lymphs Abs: 1 10*3/uL (ref 0.7–4.0)
MCH: 32.5 pg (ref 26.0–34.0)
MCHC: 33 g/dL (ref 30.0–36.0)
MCV: 98.5 fL (ref 80.0–100.0)
Monocytes Absolute: 0.4 10*3/uL (ref 0.1–1.0)
Monocytes Relative: 10 %
Neutro Abs: 2.7 10*3/uL (ref 1.7–7.7)
Neutrophils Relative %: 62 %
Platelets: 302 10*3/uL (ref 150–400)
RBC: 4.12 MIL/uL (ref 3.87–5.11)
RDW: 13 % (ref 11.5–15.5)
WBC: 4.4 10*3/uL (ref 4.0–10.5)
nRBC: 0 % (ref 0.0–0.2)

## 2019-02-06 LAB — COMPREHENSIVE METABOLIC PANEL
ALT: 26 U/L (ref 0–44)
AST: 28 U/L (ref 15–41)
Albumin: 4.2 g/dL (ref 3.5–5.0)
Alkaline Phosphatase: 57 U/L (ref 38–126)
Anion gap: 8 (ref 5–15)
BUN: 12 mg/dL (ref 8–23)
CO2: 26 mmol/L (ref 22–32)
Calcium: 8.9 mg/dL (ref 8.9–10.3)
Chloride: 100 mmol/L (ref 98–111)
Creatinine, Ser: 0.64 mg/dL (ref 0.44–1.00)
GFR calc Af Amer: >60 mL/min (ref >60)
GFR calc non Af Amer: >60 mL/min (ref >60)
Glucose, Bld: 78 mg/dL (ref 70–99)
Potassium: 4 mmol/L (ref 3.5–5.1)
Sodium: 134 mmol/L — ABNORMAL LOW (ref 135–145)
Total Bilirubin: 0.2 mg/dL — ABNORMAL LOW (ref 0.3–1.2)
Total Protein: 6.7 g/dL (ref 6.5–8.1)

## 2019-02-06 LAB — SURGICAL PCR SCREEN
MRSA, PCR: NEGATIVE
Staphylococcus aureus: NEGATIVE

## 2019-02-06 NOTE — Progress Notes (Signed)
PCP - Dr. Laurence Aly Cardiologist - none  Chest x-ray - no EKG - no Stress Test - no ECHO - no Cardiac Cath - no  Sleep Study - no CPAP -   Fasting Blood Sugar - NA Checks Blood Sugar _____ times a day  Blood Thinner Instructions:NA Aspirin Instructions: Last Dose:  Anesthesia review:   Patient denies shortness of breath, fever, cough and chest pain at PAT appointment yes  Patient verbalized understanding of instructions that were given to them at the PAT appointment. Patient was also instructed that they will need to review over the PAT instructions again at home before surgery. 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 She is able to open mouth wider than 3 fingers without distress.

## 2019-02-08 ENCOUNTER — Other Ambulatory Visit (HOSPITAL_COMMUNITY)
Admission: RE | Admit: 2019-02-08 | Discharge: 2019-02-08 | Disposition: A | Discharge status: Home / Self Care | Payer: PPO | Source: Ambulatory Visit | Attending: Orthopedic Surgery | Admitting: Orthopedic Surgery

## 2019-02-08 DIAGNOSIS — Z20828 Contact with and (suspected) exposure to other viral communicable diseases: Secondary | ICD-10-CM | POA: Diagnosis not present

## 2019-02-08 DIAGNOSIS — Z01812 Encounter for preprocedural laboratory examination: Secondary | ICD-10-CM | POA: Insufficient documentation

## 2019-02-09 LAB — NOVEL CORONAVIRUS, NAA (HOSP ORDER, SEND-OUT TO REF LAB; TAT 18-24 HRS): SARS-CoV-2, NAA: NOT DETECTED

## 2019-02-10 NOTE — Anesthesia Preprocedure Evaluation (Addendum)
Anesthesia Evaluation  Patient identified by MRN, date of birth, ID band  Reviewed: Allergy & Precautions, NPO status , Patient's Chart, lab work & pertinent test results  History of Anesthesia Complications Negative for: history of anesthetic complications  Airway Mallampati: II  TM Distance: >3 FB Neck ROM: Full    Dental no notable dental hx.    Pulmonary neg pulmonary ROS,    Pulmonary exam normal        Cardiovascular negative cardio ROS Normal cardiovascular exam     Neuro/Psych Multilevel DJD  negative psych ROS   GI/Hepatic negative GI ROS, Neg liver ROS,   Endo/Other  negative endocrine ROS  Renal/GU negative Renal ROS  negative genitourinary   Musculoskeletal  (+) Arthritis ,   Abdominal   Peds  Hematology negative hematology ROS (+)   Anesthesia Other Findings   Reproductive/Obstetrics negative OB ROS                            Anesthesia Physical Anesthesia Plan  ASA: II  Anesthesia Plan: Spinal   Post-op Pain Management:  [place]ional for Post-op pain   Induction:   PONV Risk Score and Plan: 3 and Treatment may vary due to age or medical condition, Ondansetron, Propofol infusion, Dexamethasone and Midazolam  Airway Management Planned: Natural Airway and Simple Face Mask  Additional Equipment: None  Intra-op Plan:   Post-operative Plan:   Informed Consent: I have reviewed the patients History and Physical, chart, labs and discussed the procedure including the risks, benefits and alternatives for the proposed anesthesia with the patient or authorized representative who has indicated his/her understanding and acceptance.     Dental advisory given  Plan Discussed with: CRNA  Anesthesia Plan Comments:        Anesthesia Quick Evaluation

## 2019-02-11 ENCOUNTER — Encounter (HOSPITAL_COMMUNITY): Payer: Self-pay | Admitting: Anesthesiology

## 2019-02-11 MED ORDER — BUPIVACAINE LIPOSOME 1.3 % IJ SUSP
20.0000 mL | Freq: Once | INTRAMUSCULAR | Status: DC
  Filled 2019-02-11: qty 20

## 2019-02-12 ENCOUNTER — Other Ambulatory Visit: Payer: Self-pay

## 2019-02-12 ENCOUNTER — Observation Stay (HOSPITAL_COMMUNITY)
Admission: RE | Admit: 2019-02-12 | Discharge: 2019-02-13 | Disposition: A | Discharge status: Home / Self Care | Payer: PPO | Source: Ambulatory Visit | Attending: Orthopedic Surgery | Admitting: Orthopedic Surgery

## 2019-02-12 ENCOUNTER — Encounter (HOSPITAL_COMMUNITY): Payer: Self-pay | Admitting: Emergency Medicine

## 2019-02-12 ENCOUNTER — Encounter (HOSPITAL_COMMUNITY)
Admission: RE | Disposition: A | Discharge status: Home / Self Care | Payer: Self-pay | Source: Ambulatory Visit | Attending: Orthopedic Surgery

## 2019-02-12 ENCOUNTER — Ambulatory Visit (HOSPITAL_COMMUNITY): Payer: PPO | Admitting: Anesthesiology

## 2019-02-12 ENCOUNTER — Ambulatory Visit (HOSPITAL_COMMUNITY): Payer: PPO | Admitting: Vascular Surgery

## 2019-02-12 DIAGNOSIS — Z833 Family history of diabetes mellitus: Secondary | ICD-10-CM | POA: Diagnosis not present

## 2019-02-12 DIAGNOSIS — Z7982 Long term (current) use of aspirin: Secondary | ICD-10-CM | POA: Insufficient documentation

## 2019-02-12 DIAGNOSIS — E782 Mixed hyperlipidemia: Secondary | ICD-10-CM | POA: Insufficient documentation

## 2019-02-12 DIAGNOSIS — Z79899 Other long term (current) drug therapy: Secondary | ICD-10-CM | POA: Insufficient documentation

## 2019-02-12 DIAGNOSIS — Z818 Family history of other mental and behavioral disorders: Secondary | ICD-10-CM | POA: Diagnosis not present

## 2019-02-12 DIAGNOSIS — M1711 Unilateral primary osteoarthritis, right knee: Secondary | ICD-10-CM | POA: Diagnosis not present

## 2019-02-12 DIAGNOSIS — Z791 Long term (current) use of non-steroidal anti-inflammatories (NSAID): Secondary | ICD-10-CM | POA: Insufficient documentation

## 2019-02-12 DIAGNOSIS — G8918 Other acute postprocedural pain: Secondary | ICD-10-CM | POA: Diagnosis not present

## 2019-02-12 DIAGNOSIS — Z8249 Family history of ischemic heart disease and other diseases of the circulatory system: Secondary | ICD-10-CM | POA: Insufficient documentation

## 2019-02-12 DIAGNOSIS — M8580 Other specified disorders of bone density and structure, unspecified site: Secondary | ICD-10-CM | POA: Diagnosis not present

## 2019-02-12 DIAGNOSIS — Z820 Family history of epilepsy and other diseases of the nervous system: Secondary | ICD-10-CM | POA: Insufficient documentation

## 2019-02-12 DIAGNOSIS — E871 Hypo-osmolality and hyponatremia: Secondary | ICD-10-CM | POA: Diagnosis not present

## 2019-02-12 DIAGNOSIS — E559 Vitamin D deficiency, unspecified: Secondary | ICD-10-CM | POA: Diagnosis not present

## 2019-02-12 DIAGNOSIS — Z96659 Presence of unspecified artificial knee joint: Secondary | ICD-10-CM

## 2019-02-12 DIAGNOSIS — Z8262 Family history of osteoporosis: Secondary | ICD-10-CM | POA: Insufficient documentation

## 2019-02-12 DIAGNOSIS — M26609 Unspecified temporomandibular joint disorder, unspecified side: Secondary | ICD-10-CM | POA: Diagnosis not present

## 2019-02-12 DIAGNOSIS — M479 Spondylosis, unspecified: Secondary | ICD-10-CM | POA: Diagnosis not present

## 2019-02-12 DIAGNOSIS — Z96652 Presence of left artificial knee joint: Secondary | ICD-10-CM | POA: Diagnosis not present

## 2019-02-12 HISTORY — PX: TOTAL KNEE ARTHROPLASTY: SHX125

## 2019-02-12 SURGERY — ARTHROPLASTY, KNEE, TOTAL
Anesthesia: Spinal | Site: Knee | Laterality: Right

## 2019-02-12 MED ORDER — POVIDONE-IODINE 10 % EX SWAB
2.0000 "application " | Freq: Once | CUTANEOUS | Status: AC
  Administered 2019-02-12: 2 via TOPICAL

## 2019-02-12 MED ORDER — SENNOSIDES-DOCUSATE SODIUM 8.6-50 MG PO TABS: 1.0000 | ORAL_TABLET | Freq: Every evening | ORAL | Status: DC | PRN

## 2019-02-12 MED ORDER — ACETAMINOPHEN 500 MG PO TABS
1000.0000 mg | ORAL_TABLET | Freq: Once | ORAL | Status: AC
  Administered 2019-02-12: 1000 mg via ORAL
  Filled 2019-02-12: qty 2

## 2019-02-12 MED ORDER — BUPIVACAINE LIPOSOME 1.3 % IJ SUSP
INTRAMUSCULAR | Status: DC | PRN
  Administered 2019-02-12: 20 mL

## 2019-02-12 MED ORDER — ACETAMINOPHEN 500 MG PO TABS
1000.0000 mg | ORAL_TABLET | Freq: Four times a day (QID) | ORAL | Status: AC
  Administered 2019-02-12 (×3): 1000 mg via ORAL
  Filled 2019-02-12 (×4): qty 2

## 2019-02-12 MED ORDER — ASPIRIN EC 325 MG PO TBEC
325.0000 mg | DELAYED_RELEASE_TABLET | Freq: Two times a day (BID) | ORAL | Status: DC
  Administered 2019-02-13: 325 mg via ORAL
  Filled 2019-02-12: qty 1

## 2019-02-12 MED ORDER — CELECOXIB 200 MG PO CAPS
400.0000 mg | ORAL_CAPSULE | Freq: Once | ORAL | Status: AC
  Administered 2019-02-12: 400 mg via ORAL
  Filled 2019-02-12: qty 2

## 2019-02-12 MED ORDER — PROPOFOL 500 MG/50ML IV EMUL
INTRAVENOUS | Status: AC
  Filled 2019-02-12: qty 100

## 2019-02-12 MED ORDER — SIMVASTATIN 20 MG PO TABS
20.0000 mg | ORAL_TABLET | Freq: Every day | ORAL | Status: DC
  Administered 2019-02-12: 20 mg via ORAL
  Filled 2019-02-12: qty 1

## 2019-02-12 MED ORDER — PROPOFOL 10 MG/ML IV BOLUS
INTRAVENOUS | Status: AC
  Filled 2019-02-12: qty 20

## 2019-02-12 MED ORDER — BUPIVACAINE-EPINEPHRINE (PF) 0.5% -1:200000 IJ SOLN
INTRAMUSCULAR | Status: DC | PRN
  Administered 2019-02-12: 15 mL via PERINEURAL

## 2019-02-12 MED ORDER — PROPOFOL 500 MG/50ML IV EMUL
INTRAVENOUS | Status: DC | PRN
  Administered 2019-02-12: 75 ug/kg/min via INTRAVENOUS

## 2019-02-12 MED ORDER — PHENOL 1.4 % MT LIQD: 1.0000 | OROMUCOSAL | Status: DC | PRN

## 2019-02-12 MED ORDER — DEXAMETHASONE SODIUM PHOSPHATE 10 MG/ML IJ SOLN
8.0000 mg | Freq: Once | INTRAMUSCULAR | Status: AC
  Administered 2019-02-12: 6 mg via INTRAVENOUS

## 2019-02-12 MED ORDER — DOCUSATE SODIUM 100 MG PO CAPS
100.0000 mg | ORAL_CAPSULE | Freq: Two times a day (BID) | ORAL | Status: DC
  Administered 2019-02-12 – 2019-02-13 (×3): 100 mg via ORAL
  Filled 2019-02-12 (×3): qty 1

## 2019-02-12 MED ORDER — ALUM & MAG HYDROXIDE-SIMETH 200-200-20 MG/5ML PO SUSP
30.0000 mL | ORAL | Status: DC | PRN
  Administered 2019-02-12: 30 mL via ORAL
  Filled 2019-02-12: qty 30

## 2019-02-12 MED ORDER — ZOLPIDEM TARTRATE 5 MG PO TABS: 5.0000 mg | ORAL_TABLET | Freq: Every evening | ORAL | Status: DC | PRN

## 2019-02-12 MED ORDER — SODIUM CHLORIDE 0.9 % IR SOLN
Status: DC | PRN
  Administered 2019-02-12: 1000 mL

## 2019-02-12 MED ORDER — TRAMADOL HCL 50 MG PO TABS
50.0000 mg | ORAL_TABLET | Freq: Four times a day (QID) | ORAL | Status: DC
  Administered 2019-02-12 – 2019-02-13 (×5): 50 mg via ORAL
  Filled 2019-02-12 (×5): qty 1

## 2019-02-12 MED ORDER — DEXAMETHASONE SODIUM PHOSPHATE 10 MG/ML IJ SOLN
INTRAMUSCULAR | Status: AC
  Filled 2019-02-12: qty 3

## 2019-02-12 MED ORDER — MENTHOL 3 MG MT LOZG: 1.0000 | LOZENGE | OROMUCOSAL | Status: DC | PRN

## 2019-02-12 MED ORDER — SODIUM CHLORIDE 0.9% FLUSH
INTRAVENOUS | Status: DC | PRN
  Administered 2019-02-12: 20 mL

## 2019-02-12 MED ORDER — WATER FOR IRRIGATION, STERILE IR SOLN
Status: DC | PRN
  Administered 2019-02-12: 2000 mL

## 2019-02-12 MED ORDER — HYDROMORPHONE HCL 1 MG/ML IJ SOLN: 0.5000 mg | INTRAMUSCULAR | Status: DC | PRN

## 2019-02-12 MED ORDER — EPHEDRINE SULFATE-NACL 50-0.9 MG/10ML-% IV SOSY
PREFILLED_SYRINGE | INTRAVENOUS | Status: DC | PRN
  Administered 2019-02-12 (×8): 5 mg via INTRAVENOUS

## 2019-02-12 MED ORDER — GABAPENTIN 300 MG PO CAPS
300.0000 mg | ORAL_CAPSULE | Freq: Once | ORAL | Status: AC
  Administered 2019-02-12: 300 mg via ORAL
  Filled 2019-02-12: qty 1

## 2019-02-12 MED ORDER — ONDANSETRON HCL 4 MG/2ML IJ SOLN
INTRAMUSCULAR | Status: DC | PRN
  Administered 2019-02-12: 4 mg via INTRAVENOUS

## 2019-02-12 MED ORDER — PROPOFOL 10 MG/ML IV BOLUS
INTRAVENOUS | Status: DC | PRN
  Administered 2019-02-12 (×3): 10 mg via INTRAVENOUS

## 2019-02-12 MED ORDER — DEXAMETHASONE SODIUM PHOSPHATE 10 MG/ML IJ SOLN
10.0000 mg | Freq: Once | INTRAMUSCULAR | Status: AC
  Administered 2019-02-13: 10 mg via INTRAVENOUS
  Filled 2019-02-12: qty 1

## 2019-02-12 MED ORDER — 0.9 % SODIUM CHLORIDE (POUR BTL) OPTIME
TOPICAL | Status: DC | PRN
  Administered 2019-02-12: 1000 mL

## 2019-02-12 MED ORDER — METHOCARBAMOL 500 MG PO TABS: 500.0000 mg | ORAL_TABLET | Freq: Four times a day (QID) | ORAL | Status: DC | PRN

## 2019-02-12 MED ORDER — OXYCODONE HCL 5 MG PO TABS
5.0000 mg | ORAL_TABLET | ORAL | Status: DC | PRN
  Administered 2019-02-13: 5 mg via ORAL
  Filled 2019-02-12: qty 1

## 2019-02-12 MED ORDER — GABAPENTIN 300 MG PO CAPS
300.0000 mg | ORAL_CAPSULE | Freq: Three times a day (TID) | ORAL | Status: DC
  Administered 2019-02-12 – 2019-02-13 (×3): 300 mg via ORAL
  Filled 2019-02-12 (×3): qty 1

## 2019-02-12 MED ORDER — FERROUS SULFATE 325 (65 FE) MG PO TABS
325.0000 mg | ORAL_TABLET | Freq: Three times a day (TID) | ORAL | Status: DC
  Administered 2019-02-13 (×2): 325 mg via ORAL
  Filled 2019-02-12 (×2): qty 1

## 2019-02-12 MED ORDER — ONDANSETRON HCL 4 MG/2ML IJ SOLN: 4.0000 mg | Freq: Four times a day (QID) | INTRAMUSCULAR | Status: DC | PRN

## 2019-02-12 MED ORDER — FLEET ENEMA 7-19 GM/118ML RE ENEM: 1.0000 | ENEMA | Freq: Once | RECTAL | Status: DC | PRN

## 2019-02-12 MED ORDER — ONDANSETRON HCL 4 MG PO TABS: 4.0000 mg | ORAL_TABLET | Freq: Four times a day (QID) | ORAL | Status: DC | PRN

## 2019-02-12 MED ORDER — TRANEXAMIC ACID-NACL 1000-0.7 MG/100ML-% IV SOLN
1000.0000 mg | INTRAVENOUS | Status: AC
  Administered 2019-02-12: 1000 mg via INTRAVENOUS
  Filled 2019-02-12: qty 100

## 2019-02-12 MED ORDER — PROMETHAZINE HCL 25 MG/ML IJ SOLN: 6.2500 mg | INTRAMUSCULAR | Status: DC | PRN

## 2019-02-12 MED ORDER — BUPIVACAINE HCL (PF) 0.25 % IJ SOLN
INTRAMUSCULAR | Status: DC | PRN
  Administered 2019-02-12: 30 mL

## 2019-02-12 MED ORDER — ONDANSETRON HCL 4 MG/2ML IJ SOLN
INTRAMUSCULAR | Status: AC
  Filled 2019-02-12: qty 6

## 2019-02-12 MED ORDER — BUPIVACAINE HCL (PF) 0.25 % IJ SOLN
INTRAMUSCULAR | Status: AC
  Filled 2019-02-12: qty 30

## 2019-02-12 MED ORDER — OXYCODONE HCL 5 MG PO TABS: 5.0000 mg | ORAL_TABLET | Freq: Once | ORAL | Status: DC | PRN

## 2019-02-12 MED ORDER — MIRTAZAPINE 15 MG PO TABS
15.0000 mg | ORAL_TABLET | Freq: Every day | ORAL | Status: DC
  Administered 2019-02-12: 15 mg via ORAL
  Filled 2019-02-12: qty 1

## 2019-02-12 MED ORDER — PANTOPRAZOLE SODIUM 40 MG PO TBEC
40.0000 mg | DELAYED_RELEASE_TABLET | Freq: Every day | ORAL | Status: DC
  Administered 2019-02-12 – 2019-02-13 (×2): 40 mg via ORAL
  Filled 2019-02-12 (×3): qty 1

## 2019-02-12 MED ORDER — SODIUM CHLORIDE 0.9 % IV SOLN
INTRAVENOUS | Status: DC
  Administered 2019-02-12: 14:00:00 via INTRAVENOUS

## 2019-02-12 MED ORDER — LACTATED RINGERS IV SOLN
INTRAVENOUS | Status: DC
  Administered 2019-02-12 (×2): via INTRAVENOUS

## 2019-02-12 MED ORDER — BUPIVACAINE IN DEXTROSE 0.75-8.25 % IT SOLN
INTRATHECAL | Status: DC | PRN
  Administered 2019-02-12: 1.6 mL via INTRATHECAL

## 2019-02-12 MED ORDER — METOCLOPRAMIDE HCL 5 MG PO TABS: 5.0000 mg | ORAL_TABLET | Freq: Three times a day (TID) | ORAL | Status: DC | PRN

## 2019-02-12 MED ORDER — SODIUM CHLORIDE (PF) 0.9 % IJ SOLN
INTRAMUSCULAR | Status: AC
  Filled 2019-02-12: qty 20

## 2019-02-12 MED ORDER — EPHEDRINE 5 MG/ML INJ
INTRAVENOUS | Status: AC
  Filled 2019-02-12: qty 10

## 2019-02-12 MED ORDER — PHENYLEPHRINE 40 MCG/ML (10ML) SYRINGE FOR IV PUSH (FOR BLOOD PRESSURE SUPPORT)
PREFILLED_SYRINGE | INTRAVENOUS | Status: DC | PRN
  Administered 2019-02-12: 40 ug via INTRAVENOUS
  Administered 2019-02-12 (×2): 80 ug via INTRAVENOUS
  Administered 2019-02-12: 40 ug via INTRAVENOUS
  Administered 2019-02-12: 80 ug via INTRAVENOUS
  Administered 2019-02-12 (×2): 40 ug via INTRAVENOUS

## 2019-02-12 MED ORDER — TRANEXAMIC ACID-NACL 1000-0.7 MG/100ML-% IV SOLN
1000.0000 mg | Freq: Once | INTRAVENOUS | Status: AC
  Administered 2019-02-12: 1000 mg via INTRAVENOUS
  Filled 2019-02-12: qty 100

## 2019-02-12 MED ORDER — MIDAZOLAM HCL 5 MG/5ML IJ SOLN
INTRAMUSCULAR | Status: DC | PRN
  Administered 2019-02-12: 2 mg via INTRAVENOUS

## 2019-02-12 MED ORDER — CEFAZOLIN SODIUM-DEXTROSE 2-4 GM/100ML-% IV SOLN
2.0000 g | INTRAVENOUS | Status: AC
  Administered 2019-02-12: 2 g via INTRAVENOUS
  Filled 2019-02-12: qty 100

## 2019-02-12 MED ORDER — FENTANYL CITRATE (PF) 100 MCG/2ML IJ SOLN: 25.0000 ug | INTRAMUSCULAR | Status: DC | PRN

## 2019-02-12 MED ORDER — PHENYLEPHRINE 40 MCG/ML (10ML) SYRINGE FOR IV PUSH (FOR BLOOD PRESSURE SUPPORT)
PREFILLED_SYRINGE | INTRAVENOUS | Status: AC
  Filled 2019-02-12: qty 10

## 2019-02-12 MED ORDER — MIDAZOLAM HCL 2 MG/2ML IJ SOLN
INTRAMUSCULAR | Status: AC
  Filled 2019-02-12: qty 2

## 2019-02-12 MED ORDER — METOCLOPRAMIDE HCL 5 MG/ML IJ SOLN: 5.0000 mg | Freq: Three times a day (TID) | INTRAMUSCULAR | Status: DC | PRN

## 2019-02-12 MED ORDER — ACETAMINOPHEN 500 MG PO TABS: 1000.0000 mg | ORAL_TABLET | Freq: Once | ORAL | Status: DC

## 2019-02-12 MED ORDER — METHOCARBAMOL 500 MG IVPB - SIMPLE MED
500.0000 mg | Freq: Four times a day (QID) | INTRAVENOUS | Status: DC | PRN
  Filled 2019-02-12: qty 50

## 2019-02-12 MED ORDER — CEFAZOLIN SODIUM-DEXTROSE 2-4 GM/100ML-% IV SOLN
2.0000 g | Freq: Four times a day (QID) | INTRAVENOUS | Status: AC
  Administered 2019-02-12 (×2): 2 g via INTRAVENOUS
  Filled 2019-02-12 (×2): qty 100

## 2019-02-12 MED ORDER — DIPHENHYDRAMINE HCL 12.5 MG/5ML PO ELIX: 12.5000 mg | ORAL_SOLUTION | ORAL | Status: DC | PRN

## 2019-02-12 MED ORDER — CHLORHEXIDINE GLUCONATE 4 % EX LIQD: 60.0000 mL | Freq: Once | CUTANEOUS | Status: DC

## 2019-02-12 MED ORDER — FENTANYL CITRATE (PF) 100 MCG/2ML IJ SOLN
INTRAMUSCULAR | Status: DC | PRN
  Administered 2019-02-12 (×2): 50 ug via INTRAVENOUS

## 2019-02-12 MED ORDER — BISACODYL 5 MG PO TBEC: 5.0000 mg | DELAYED_RELEASE_TABLET | Freq: Every day | ORAL | Status: DC | PRN

## 2019-02-12 MED ORDER — FENTANYL CITRATE (PF) 100 MCG/2ML IJ SOLN
INTRAMUSCULAR | Status: AC
  Filled 2019-02-12: qty 2

## 2019-02-12 MED ORDER — OXYCODONE HCL 5 MG/5ML PO SOLN: 5.0000 mg | Freq: Once | ORAL | Status: DC | PRN

## 2019-02-12 SURGICAL SUPPLY — 67 items
ARTISURF 10M PLY R 6-9CD KNEE (Knees) ×2 IMPLANT
BAG SPEC THK2 15X12 ZIP CLS (MISCELLANEOUS) ×1
BAG ZIPLOCK 12X15 (MISCELLANEOUS) ×3 IMPLANT
BLADE SAGITTAL 13X1.27X60 (BLADE) ×2 IMPLANT
BLADE SAGITTAL 13X1.27X60MM (BLADE) ×1
BLADE SAW SGTL 83.5X18.5 (BLADE) ×3 IMPLANT
BLADE SURG 15 STRL LF DISP TIS (BLADE) ×1 IMPLANT
BLADE SURG 15 STRL SS (BLADE) ×3
BLADE SURG SZ10 CARB STEEL (BLADE) ×6 IMPLANT
BNDG ELASTIC 6X5.8 VLCR STR LF (GAUZE/BANDAGES/DRESSINGS) ×3 IMPLANT
BOWL SMART MIX CTS (DISPOSABLE) ×3 IMPLANT
BSPLAT TIB 5D D CMNT STM RT (Knees) ×1 IMPLANT
CEMENT BONE SIMPLEX SPEEDSET (Cement) ×6 IMPLANT
CLOSURE STERI-STRIP 1/2X4 (GAUZE/BANDAGES/DRESSINGS) ×1
CLOSURE WOUND 1/2 X4 (GAUZE/BANDAGES/DRESSINGS) ×1
CLSR STERI-STRIP ANTIMIC 1/2X4 (GAUZE/BANDAGES/DRESSINGS) ×1 IMPLANT
COVER SURGICAL LIGHT HANDLE (MISCELLANEOUS) ×3 IMPLANT
COVER WAND RF STERILE (DRAPES) IMPLANT
CUFF TOURN SGL QUICK 34 (TOURNIQUET CUFF) ×3
CUFF TRNQT CYL 34X4.125X (TOURNIQUET CUFF) ×1 IMPLANT
DECANTER SPIKE VIAL GLASS SM (MISCELLANEOUS) ×6 IMPLANT
DRAPE INCISE IOBAN 66X45 STRL (DRAPES) ×6 IMPLANT
DRAPE U-SHAPE 47X51 STRL (DRAPES) ×3 IMPLANT
DRESSING AQUACEL AG SP 3.5X10 (GAUZE/BANDAGES/DRESSINGS) IMPLANT
DRSG AQUACEL AG ADV 3.5X10 (GAUZE/BANDAGES/DRESSINGS) ×3 IMPLANT
DRSG AQUACEL AG SP 3.5X10 (GAUZE/BANDAGES/DRESSINGS) ×3
DURAPREP 26ML APPLICATOR (WOUND CARE) ×6 IMPLANT
ELECT REM PT RETURN 15FT ADLT (MISCELLANEOUS) ×3 IMPLANT
FEMUR  CMT CCR STD SZ6 R KNEE (Knees) ×2 IMPLANT
FEMUR CMT CCR STD SZ6 R KNEE (Knees) ×1 IMPLANT
FEMUR CMTD CCR STD SZ6 R KNEE (Knees) IMPLANT
GLOVE BIOGEL M STRL SZ7.5 (GLOVE) ×3 IMPLANT
GLOVE BIOGEL PI IND STRL 7.5 (GLOVE) ×1 IMPLANT
GLOVE BIOGEL PI IND STRL 8.5 (GLOVE) ×2 IMPLANT
GLOVE BIOGEL PI INDICATOR 7.5 (GLOVE) ×2
GLOVE BIOGEL PI INDICATOR 8.5 (GLOVE) ×4
GLOVE SURG ORTHO 8.0 STRL STRW (GLOVE) ×9 IMPLANT
GOWN STRL REUS W/ TWL XL LVL3 (GOWN DISPOSABLE) ×2 IMPLANT
GOWN STRL REUS W/TWL XL LVL3 (GOWN DISPOSABLE) ×6
HANDPIECE INTERPULSE COAX TIP (DISPOSABLE) ×3
HDLS TROCR DRIL PIN KNEE 75 (Miscellaneous) ×2 IMPLANT
HOLDER FOLEY CATH W/STRAP (MISCELLANEOUS) ×3 IMPLANT
HOOD PEEL AWAY FLYTE STAYCOOL (MISCELLANEOUS) ×11 IMPLANT
KIT TURNOVER KIT A (KITS) IMPLANT
MANIFOLD NEPTUNE II (INSTRUMENTS) ×3 IMPLANT
NEEDLE HYPO 22GX1.5 SAFETY (NEEDLE) ×3 IMPLANT
NS IRRIG 1000ML POUR BTL (IV SOLUTION) ×3 IMPLANT
PACK TOTAL KNEE CUSTOM (KITS) ×3 IMPLANT
PENCIL SMOKE EVACUATOR (MISCELLANEOUS) ×2 IMPLANT
PIN DRILL HDLS TROCAR 75 4PK (Miscellaneous) IMPLANT
PROTECTOR NERVE ULNAR (MISCELLANEOUS) ×3 IMPLANT
SET HNDPC FAN SPRY TIP SCT (DISPOSABLE) ×1 IMPLANT
STEM POLY PAT PLY 32M KNEE (Knees) ×2 IMPLANT
STEM TIBIA 5 DEG SZ D R KNEE (Knees) IMPLANT
STRIP CLOSURE SKIN 1/2X4 (GAUZE/BANDAGES/DRESSINGS) ×2 IMPLANT
SUT BONE WAX W31G (SUTURE) ×3 IMPLANT
SUT MNCRL AB 3-0 PS2 18 (SUTURE) ×3 IMPLANT
SUT STRATAFIX 0 PDS 27 VIOLET (SUTURE) ×3
SUT STRATAFIX PDS+ 0 24IN (SUTURE) ×3 IMPLANT
SUT VIC AB 1 CT1 36 (SUTURE) ×3 IMPLANT
SUTURE STRATFX 0 PDS 27 VIOLET (SUTURE) ×1 IMPLANT
SYR CONTROL 10ML LL (SYRINGE) ×6 IMPLANT
TIBIA STEM 5 DEG SZ D R KNEE (Knees) ×3 IMPLANT
TRAY FOLEY MTR SLVR 14FR STAT (SET/KITS/TRAYS/PACK) ×2 IMPLANT
WATER STERILE IRR 1000ML POUR (IV SOLUTION) ×6 IMPLANT
WRAP KNEE MAXI GEL POST OP (GAUZE/BANDAGES/DRESSINGS) ×3 IMPLANT
YANKAUER SUCT BULB TIP 10FT TU (MISCELLANEOUS) ×3 IMPLANT

## 2019-02-12 NOTE — Transfer of Care (Signed)
Immediate Anesthesia Transfer of Care Note  Patient: Courtney Shaffer  Procedure(s) Performed: Procedure(s): TOTAL KNEE ARTHROPLASTY (Right)  Patient Location: PACU  Anesthesia Type:Spinal  Level of Consciousness:  sedated, patient cooperative and responds to stimulation  Airway & Oxygen Therapy:Patient Spontanous Breathing and Patient connected to face mask oxgen  Post-op Assessment:  Report given to PACU RN and Post -op Vital signs reviewed and stable  Post vital signs:  Reviewed and stable  Last Vitals:  Vitals:   02/12/19 0602 02/12/19 0848  BP: 137/69 (!) 104/58  Pulse: 77 73  Resp: 14 12  Temp: 36.8 C (!) (P) 36.4 C  SpO2: 123XX123 [id number]    Complications: No apparent anesthesia complications

## 2019-02-12 NOTE — Anesthesia Procedure Notes (Signed)
Spinal  Patient location during procedure: OR Start time: 02/12/2019 7:21 AM End time: 02/12/2019 7:23 AM Staffing Anesthesiologist: Brennan Bailey, MD Performed: anesthesiologist  Preanesthetic Checklist Completed: patient identified, surgical consent, pre-op evaluation, timeout performed, IV checked, risks and benefits discussed and monitors and equipment checked Spinal Block Patient position: sitting Prep: site prepped and draped and DuraPrep Patient monitoring: continuous pulse ox, blood pressure and heart rate Approach: midline Location: L3-4 Injection technique: single-shot Needle Needle type: Pencan  Needle gauge: 24 G Needle length: 9 cm Additional Notes Risks, benefits, and alternative discussed. Patient gave consent to procedure. Prepped and draped in sitting position. Patient sedated but responsive to voice. Clear CSF obtained after one needle pass. Positive terminal aspiration. No pain or paraesthesias with injection. Patient tolerated procedure well. Vital signs stable. Tawny Asal, MD

## 2019-02-12 NOTE — H&P (Signed)
SUDE BYERS MRN:  [id number] DOB/SEX:  [birth year]/female  CHIEF COMPLAINT:  Painful right Knee  HISTORY: Patient is a 73 y.o. female presented with a history of pain in the right knee. Onset of symptoms was gradual starting a few years ago with gradually worsening course since that time. Patient has been treated conservatively with over-the-counter NSAIDs and activity modification. Patient currently rates pain in the knee at 10 out of 10 with activity. There is pain at night.  PAST MEDICAL HISTORY: Patient Active Problem List   Diagnosis Date Noted  . 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 Preventative health care 10/12/2015  . Hyponatremia 10/12/2015  . Foot lesion 05/04/2015  . Multilevel degenerative disc disease 04/29/2015  . History of chicken pox   . Osteopenia 08/22/2013  . Hyperlipidemia, mixed 08/22/2013  . Atrophic vaginitis 12/06/2012  . HZV (herpes zoster virus) post herpetic neuralgia 12/06/2012  . Vitamin D deficiency 08/25/2011   Past Medical History:  Diagnosis Date  . Atrophic vaginitis   . 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 Diverticulosis 04/01/2014   Sigmoid and ascending colon  . History of chicken pox   . History of shingles 12/2004  . Hyperlipidemia   . Hyponatremia 10/12/2015  . Multilevel degenerative disc disease 04/29/2015   knees, back  . OA (osteoarthritis)    TMJ  . Osteopenia 08/22/2013  . Postherpetic neuralgia   . 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 Toe fracture, left 07/2011  . Vitamin D deficiency    Past Surgical History:  Procedure Laterality Date  . ADENOIDECTOMY    . APPENDECTOMY    . COLONOSCOPY  11/20/2003   w/Dr.William Austin=diverticulosis&hemorrhoids  . COLONOSCOPY  04/01/2014  . JOINT REPLACEMENT    . KNEE ARTHROSCOPY Bilateral    Wainer left, Collins right  . TOTAL KNEE ARTHROPLASTY Left 04/03/2018   Procedure: TOTAL KNEE ARTHROPLASTY;  Surgeon: Vickey Huger, MD;   Location: [facility];  Service: Orthopedics;  Laterality: Left;  adductor block  . WISDOM TOOTH EXTRACTION  1971     MEDICATIONS:   Medications Prior to Admission  Medication Sig Dispense Refill Last Dose  . Ascorbic Acid (VITAMIN C) 1000 MG tablet Take 1,000 mg by mouth daily.     . B Complex Vitamins (VITAMIN-B COMPLEX) TABS Take 1 tablet by mouth daily.      Marland Kitchen CALCIUM-MAGNESIUM-VITAMIN D PO Take 30 mLs by mouth daily.     . Cholecalciferol (DIALYVITE VITAMIN D 5000) 125 MCG (5000 UT) capsule Take 10,000 Units by mouth daily.     . Glucosamine HCl (GLUCOSAMINE PO) Take 1 tablet by mouth 4 (four) times a week.      Marland Kitchen ibuprofen (ADVIL) 200 MG tablet Take 200 mg by mouth every 8 (eight) hours as needed for moderate pain.     . mirtazapine (REMERON) 15 MG tablet Take 15 mg by mouth at bedtime.     . Omega-3 Fatty Acids (FISH OIL PO) Take 1 Dose by mouth daily.      Vladimir Faster Glycol-Propyl Glycol (SYSTANE) 0.4-0.3 % SOLN Place 1 drop into both eyes 3 (three) times daily as needed (for dry eyes).      . Probiotic Product (PROBIOTIC PO) Take 1 capsule by mouth 4 (four) times a week.      . simvastatin (ZOCOR) 20 MG tablet TAKE 1 TABLET BY MOUTH EVERY DAY IN THE EVENING (Patient taking differently: Take 20 mg by mouth at bedtime. )  90 tablet 3     ALLERGIES:  No Known Allergies  REVIEW OF SYSTEMS:  A comprehensive review of systems was negative except for: Musculoskeletal: positive for arthralgias and bone pain   FAMILY HISTORY:   Family History  Problem Relation Age of Onset  . Osteoporosis Mother   . Mental illness Mother        depression  . Hypertension Brother   . Hyperlipidemia Brother   . Diabetes Father   . Hyperlipidemia Father   . Hypertension Father   . Mental illness Daughter        anxiety depression  . Mental illness Maternal Grandmother   . Osteoporosis Maternal Grandmother   . Mental illness Maternal Grandfather   . Mental illness Maternal Aunt   . Mental illness  Maternal Uncle   . Vision loss Son   . Colon cancer Neg Hx     SOCIAL HISTORY:   Social History   Tobacco Use  . Smoking status: Never Smoker  . Smokeless tobacco: Never Used  Substance Use Topics  . Alcohol use: No     EXAMINATION:  Vital signs in last 24 hours: Temp:  [98.2 F (36.8 C)] 98.2 F (36.8 C) (11/02 0602) Pulse Rate:  [77] 77 (11/02 0602) Resp:  [14] 14 (11/02 0602) BP: (137)/(69) 137/69 (11/02 0602) SpO2:  [100 %] 100 % (11/02 0602)  BP 137/69   Pulse 77   Temp 98.2 F (36.8 C) (Oral)   Resp 14   SpO2 100%   General Appearance:    Alert, cooperative, no distress, appears stated age  Head:    Normocephalic, without obvious abnormality, atraumatic  Eyes:    PERRL, conjunctiva/corneas clear, EOM's intact, fundi    benign, both eyes  Ears:    Normal TM's and external ear canals, both ears  Nose:   Nares normal, septum midline, mucosa normal, no drainage    or sinus tenderness  Throat:   Lips, mucosa, and tongue normal; teeth and gums normal  Neck:   Supple, symmetrical, trachea midline, no adenopathy;    thyroid:  no enlargement/tenderness/nodules; no carotid   bruit or JVD  Back:     Symmetric, no curvature, ROM normal, no CVA tenderness  Lungs:     Clear to auscultation bilaterally, respirations unlabored  Chest Wall:    No tenderness or deformity   Heart:    Regular rate and rhythm, S1 and S2 normal, no murmur, rub   or gallop  Breast Exam:    No tenderness, masses, or nipple abnormality  Abdomen:     Soft, non-tender, bowel sounds active all four quadrants,    no masses, no organomegaly  Genitalia:    Normal female without lesion, discharge or tenderness  Rectal:    Normal tone, no masses or tenderness;   guaiac negative stool  Extremities:   Extremities normal, atraumatic, no cyanosis or edema  Pulses:   2+ and symmetric all extremities  Skin:   Skin color, texture, turgor normal, no rashes or lesions  Lymph nodes:   Cervical, supraclavicular,  and axillary nodes normal  Neurologic:   CNII-XII intact, normal strength, sensation and reflexes    throughout    Musculoskeletal:  ROM 0-120, Ligaments intact,  Imaging Review Plain radiographs demonstrate severe degenerative joint disease of the right knee. The overall alignment is neutral. The bone quality appears to be good for age and reported activity level.  Assessment/Plan: Primary osteoarthritis, right knee   The patient history, physical examination  and imaging studies are consistent with advanced degenerative joint disease of the right knee. The patient has failed conservative treatment.  The clearance notes were reviewed.  After discussion with the patient it was felt that Total Knee Replacement was indicated. The procedure,  risks, and benefits of total knee arthroplasty were presented and reviewed. The risks including but not limited to aseptic loosening, infection, blood clots, vascular injury, stiffness, patella tracking problems complications among others were discussed. The patient acknowledged the explanation, agreed to proceed with the plan.  Preoperative templating of the joint replacement has been completed, documented, and submitted to the Operating Room personnel in order to optimize intra-operative equipment management.    Patient's anticipated LOS is less than 2 midnights, meeting these requirements: - Lives within 1 hour of care - Has a competent adult at home to recover with post-op recover - NO history of  - Chronic pain requiring opiods  - Diabetes  - Coronary Artery Disease  - Heart failure  - Heart attack  - Stroke  - DVT/VTE  - Cardiac arrhythmia  - Respiratory Failure/COPD  - Renal failure  - Anemia  - Advanced Liver disease       Donia Ast 02/12/2019, 6:12 AM

## 2019-02-12 NOTE — Anesthesia Procedure Notes (Signed)
Anesthesia Regional Block: Adductor canal block   Pre-Anesthetic Checklist: ,, timeout performed, Correct Patient, Correct Site, Correct Laterality, Correct Procedure, Correct Position, site marked, Risks and benefits discussed, pre-op evaluation,  At surgeon's request and post-op pain management  Laterality: Right  Prep: Maximum Sterile Barrier Precautions used, chloraprep       Needles:  Injection technique: Single-shot  Needle Type: Echogenic Stimulator Needle     Needle Length: 9cm  Needle Gauge: 22     Additional Needles:   Procedures:,,,, ultrasound used (permanent image in chart),,,,  Narrative:  Start time: 02/12/2019 6:53 AM End time: 02/12/2019 6:55 AM Injection made incrementally with aspirations every 5 mL.  Performed by: Personally  Anesthesiologist: Brennan Bailey, MD  Additional Notes: Risks, benefits, and alternative discussed. Patient gave consent for procedure. Patient prepped and draped in sterile fashion. Sedation administered, patient remains easily responsive to voice. Relevant anatomy identified with ultrasound guidance. Local anesthetic given in 5cc increments with no signs or symptoms of intravascular injection. No pain or paraesthesias with injection. Patient monitored throughout procedure with signs of LAST or immediate complications. Tolerated well. Ultrasound image placed in chart.  Tawny Asal, MD

## 2019-02-12 NOTE — Anesthesia Postprocedure Evaluation (Signed)
Anesthesia Post Note  Patient: Courtney Shaffer  Procedure(s) Performed: TOTAL KNEE ARTHROPLASTY (Right Knee)     Patient location during evaluation: PACU Anesthesia Type: Spinal Level of consciousness: awake and alert and oriented Pain management: pain level controlled Vital Signs Assessment: post-procedure vital signs reviewed and stable Respiratory status: spontaneous breathing, nonlabored ventilation and respiratory function stable Cardiovascular status: blood pressure returned to baseline Postop Assessment: no apparent nausea or vomiting, spinal receding, no headache and no backache Anesthetic complications: no    Last Vitals:  Vitals:   02/12/19 0602 02/12/19 0848  BP: 137/69 (!) 104/58  Pulse: 77 73  Resp: 14 12  Temp: 36.8 C (!) 36.4 C  SpO2: 100% 100%    Last Pain:  Vitals:   02/12/19 0945  TempSrc:   PainSc: 0-No pain                 Brennan Bailey

## 2019-02-12 NOTE — Op Note (Signed)
TOTAL KNEE REPLACEMENT OPERATIVE NOTE:  02/12/2019  [id number] AM  PATIENT:  Courtney Shaffer  73 y.o. female  PRE-OPERATIVE DIAGNOSIS:  RT. KNEE OSTEOARTHRITIS  POST-OPERATIVE DIAGNOSIS:  RT. KNEE OSTEOARTHRITIS  PROCEDURE:  Procedure(s): TOTAL KNEE ARTHROPLASTY  SURGEON:  Surgeon(s): Vickey Huger, MD  PHYSICIAN ASSISTANT: Carlyon Shadow, PA-C   ANESTHESIA:   spinal  SPECIMEN: None  COUNTS:  Correct  TOURNIQUET:   Total Tourniquet Time Documented: Thigh (Right) - 38 minutes Total: Thigh (Right) - 38 minutes   DICTATION:  Indication for procedure:    The patient is a 73 y.o. female who has failed conservative treatment for RT. KNEE OSTEOARTHRITIS.  Informed consent was obtained prior to anesthesia. The risks versus benefits of the operation were explain and in a way the patient can, and did, understand.     Description of procedure:     The patient was taken to the operating room and placed under anesthesia.  The patient was positioned in the usual fashion taking care that all body parts were adequately padded and/or protected.  A tourniquet was applied and the leg prepped and draped in the usual sterile fashion.  The extremity was exsanguinated with the esmarch and tourniquet inflated to 350 mmHg.  Pre-operative range of motion was normal.    A midline incision approximately 6-7 inches long was made with a #10 blade.  A new blade was used to make a parapatellar arthrotomy going 2-3 cm into the quadriceps tendon, over the patella, and alongside the medial aspect of the patellar tendon.  A synovectomy was then performed with the #10 blade and forceps. I then elevated the deep MCL off the medial tibial metaphysis subperiosteally around to the semimembranosus attachment.    I everted the patella and used calipers to measure patellar thickness.  I used the reamer to ream down to appropriate thickness to recreate the native thickness.  I then removed excess bone with the rongeur and  sagittal saw.  I used the appropriately sized template and drilled the three lug holes.  I then put the trial in place and measured the thickness with the calipers to ensure recreation of the native thickness.  The trial was then removed and the patella subluxed and the knee brought into flexion.  A homan retractor was place to retract and protect the patella and lateral structures.  A Z-retractor was place medially to protect the medial structures.  The extra-medullary alignment system was used to make cut the tibial articular surface perpendicular to the anamotic axis of the tibia and in 3 degrees of posterior slope.  The cut surface and alignment jig was removed.  I then used the intramedullary alignment guide to make a valgus cut on the distal femur.  I then marked out the epicondylar axis on the distal femur.  I then used the anterior referencing sizer and measured the femur to be a size 6.  The 4-In-1 cutting block was screwed into place in external rotation matching the posterior condylar angle, making our cuts perpendicular to the epicondylar axis.  Anterior, posterior and chamfer cuts were made with the sagittal saw.  The cutting block and cut pieces were removed.  A lamina spreader was placed in 90 degrees of flexion.  The ACL, PCL, menisci, and posterior condylar osteophytes were removed.  A 10 mm spacer blocked was found to offer good flexion and extension gap balance after minimal in degree releasing.   The scoop retractor was then placed and the femoral finishing block  was pinned in place.  The small sagittal saw was used as well as the lug drill to finish the femur.  The block and cut surfaces were removed and the medullary canal hole filled with autograft bone from the cut pieces.  The tibia was delivered forward in deep flexion and external rotation.  A size D tray was selected and pinned into place centered on the medial 1/3 of the tibial tubercle.  The reamer and keel was used to prepare  the tibia through the tray.    I then trialed with the size 6 femur, size D tibia, a 10 mm insert and the 32 patella.  I had excellent flexion/extension gap balance, excellent patella tracking.  Flexion was full and beyond 120 degrees; extension was zero.  These components were chosen and the staff opened them to me on the back table while the knee was lavaged copiously and the cement mixed.  The soft tissue was infiltrated with 60cc of exparel 1.3% through a 21 gauge needle.  I cemented in the components and removed all excess cement.  The polyethylene tibial component was snapped into place and the knee placed in extension while cement was hardening.  The capsule was infilltrated with a 60cc exparel/marcaine/saline mixture.   Once the cement was hard, the tourniquet was let down.  Hemostasis was obtained.  The arthrotomy was closed using a #1 stratofix running suture.  The deep soft tissues were closed with #0 vicryls and the subcuticular layer closed with #2-0 vicryl.  The skin was reapproximated and closed with 3.0 Monocryl.  The wound was covered with steristrips, aquacel dressing, and a TED stocking.   The patient was then awakened, extubated, and taken to the recovery room in stable condition.  BLOOD LOSS:  0000000 COMPLICATIONS:  None.  PLAN OF CARE: Admit for overnight observation  PATIENT DISPOSITION:  PACU - hemodynamically stable.    Please fax a copy of this op note to my office at [phone] (please only include page 1 and 2 of the Case Information op note)

## 2019-02-12 NOTE — Evaluation (Signed)
Physical Therapy Evaluation Patient Details Name: Courtney Shaffer MRN: [id number] DOB: 01/12/1946 Today's Date: 02/12/2019   History of Present Illness  73 yo female s/p R TKA; PMH: L TKA 2019, shingles/post herpetic neuralgia  Clinical Impression  Pt is s/p TKA resulting in the deficits listed below (see PT Problem List).  Pt amb 45' with RW and min-guard  Assist. Anticipate steady progress  Pt will benefit from skilled PT to increase their independence and safety with mobility to allow discharge to the venue listed below.      Follow Up Recommendations Follow surgeon's recommendation for DC plan and follow-up therapies    Equipment Recommendations  None recommended by PT    Recommendations for Other Services       Precautions / Restrictions Precautions Precautions: Fall;Knee Restrictions Weight Bearing Restrictions: No Other Position/Activity Restrictions: WBAT      Mobility  Bed Mobility Overal bed mobility: Needs Assistance Bed Mobility: Supine to Sit     Supine to sit: Min guard     General bed mobility comments: for safety  Transfers Overall transfer level: Needs assistance Equipment used: Rolling walker (2 wheeled) Transfers: Sit to/from Stand Sit to Stand: Min guard         General transfer comment: cues for hand placement  Ambulation/Gait Ambulation/Gait assistance: Min guard Gait Distance (Feet): 80 Feet Assistive device: Rolling walker (2 wheeled) Gait Pattern/deviations: Step-to pattern;Decreased stance time - right     General Gait Details: cues for sequence  Stairs            Wheelchair Mobility    Modified Rankin (Stroke Patients Only)       Balance                                             Pertinent Vitals/Pain Pain Assessment: 0-10 Pain Score: 0-No pain Pain Location: right knee Pain Intervention(s): Monitored during session;Limited activity within patient's tolerance;Premedicated before session     Home Living Family/patient expects to be discharged to:: Private residence Living Arrangements: Spouse/significant other Available Help at Discharge: Family Type of Home: House Home Access: Stairs to enter Entrance Stairs-Rails: Psychiatric nurse of Steps: 4 Home Layout: Two level;Able to live on main level with bedroom/bathroom Home Equipment: Gilford Rile - 2 wheels;Bedside commode;Cane - single point      Prior Function Level of Independence: Independent               Hand Dominance        Extremity/Trunk Assessment   Upper Extremity Assessment Upper Extremity Assessment: Overall WFL for tasks assessed    Lower Extremity Assessment Lower Extremity Assessment: RLE deficits/detail RLE Deficits / Details: ankle WFL; knee extension and hip flexion 3/5; knee AROM grossly 5 to 75 degrees flexion       Communication   Communication: No difficulties  Cognition Arousal/Alertness: Awake/alert Behavior During Therapy: WFL for tasks assessed/performed Overall Cognitive Status: Within Functional Limits for tasks assessed                                        General Comments      Exercises Total Joint Exercises Ankle Circles/Pumps: AROM;Both;10 reps Quad Sets: AROM;Both;10 reps   Assessment/Plan    PT Assessment Patient needs continued PT services  PT Problem  List Decreased strength;Decreased range of motion;Decreased activity tolerance;Decreased mobility;Pain       PT Treatment Interventions DME instruction;Therapeutic exercise;Functional mobility training;Patient/family education;Therapeutic activities;Gait training;Stair training    PT Goals (Current goals can be found in the Care Plan section)  Acute Rehab PT Goals PT Goal Formulation: With patient Time For Goal Achievement: 02/19/19 Potential to Achieve Goals: Good    Frequency     Barriers to discharge        Co-evaluation               AM-PAC PT "6 Clicks"  Mobility  Outcome Measure Help needed turning from your back to your side while in a flat bed without using bedrails?: A Little Help needed moving from lying on your back to sitting on the side of a flat bed without using bedrails?: A Little Help needed moving to and from a bed to a chair (including a wheelchair)?: A Little Help needed standing up from a chair using your arms (e.g., wheelchair or bedside chair)?: A Little Help needed to walk in hospital room?: A Little Help needed climbing 3-5 steps with a railing? : A Lot 6 Click Score: 17    End of Session Equipment Utilized During Treatment: Gait belt Activity Tolerance: Patient tolerated treatment well Patient left: in chair;with call bell/phone within reach;with chair alarm set   PT Visit Diagnosis: Difficulty in walking, not elsewhere classified (R26.2)    Time: 1321-1340 PT Time Calculation (min) (ACUTE ONLY): 19 min   Charges:   PT Evaluation $PT Eval Low Complexity: 1 Low          Kenyon Ana, PT  Pager: [phone] Acute Rehab Dept [facility]): [phone]   02/12/2019   [facility] 02/12/2019, 2:11 PM

## 2019-02-13 ENCOUNTER — Encounter (HOSPITAL_COMMUNITY): Payer: Self-pay | Admitting: Orthopedic Surgery

## 2019-02-13 DIAGNOSIS — M1711 Unilateral primary osteoarthritis, right knee: Secondary | ICD-10-CM | POA: Diagnosis not present

## 2019-02-13 DIAGNOSIS — Z96651 Presence of right artificial knee joint: Secondary | ICD-10-CM | POA: Diagnosis not present

## 2019-02-13 LAB — CBC
HCT: 32 % — ABNORMAL LOW (ref 36.0–46.0)
Hemoglobin: 10.4 g/dL — ABNORMAL LOW (ref 12.0–15.0)
MCH: 32.3 pg (ref 26.0–34.0)
MCHC: 32.5 g/dL (ref 30.0–36.0)
MCV: 99.4 fL (ref 80.0–100.0)
Platelets: 253 10*3/uL (ref 150–400)
RBC: 3.22 MIL/uL — ABNORMAL LOW (ref 3.87–5.11)
RDW: 13 % (ref 11.5–15.5)
WBC: 9.3 10*3/uL (ref 4.0–10.5)
nRBC: 0 % (ref 0.0–0.2)

## 2019-02-13 LAB — BASIC METABOLIC PANEL
Anion gap: 5 (ref 5–15)
BUN: 7 mg/dL — ABNORMAL LOW (ref 8–23)
CO2: 26 mmol/L (ref 22–32)
Calcium: 8.2 mg/dL — ABNORMAL LOW (ref 8.9–10.3)
Chloride: 102 mmol/L (ref 98–111)
Creatinine, Ser: 0.54 mg/dL (ref 0.44–1.00)
GFR calc Af Amer: >60 mL/min (ref >60)
GFR calc non Af Amer: >60 mL/min (ref >60)
Glucose, Bld: 111 mg/dL — ABNORMAL HIGH (ref 70–99)
Potassium: 3.9 mmol/L (ref 3.5–5.1)
Sodium: 133 mmol/L — ABNORMAL LOW (ref 135–145)

## 2019-02-13 MED ORDER — ASPIRIN 325 MG PO TBEC: 325.0000 mg | DELAYED_RELEASE_TABLET | Freq: Two times a day (BID) | ORAL | 0 refills | Status: DC

## 2019-02-13 MED ORDER — OXYCODONE HCL 5 MG PO TABS: 5.0000 mg | ORAL_TABLET | Freq: Four times a day (QID) | ORAL | 0 refills | Status: DC | PRN

## 2019-02-13 MED ORDER — METHOCARBAMOL 500 MG PO TABS: 500.0000 mg | ORAL_TABLET | Freq: Four times a day (QID) | ORAL | 0 refills | Status: DC | PRN

## 2019-02-13 NOTE — Progress Notes (Signed)
Physical Therapy Treatment Patient Details Name: Courtney Shaffer MRN: [id number] DOB: Jun 14, 1945 Today's Date: 02/13/2019    History of Present Illness 73 yo female s/p R TKA; PMH: L TKA 2019, shingles/post herpetic neuralgia    PT Comments    Pt amb to bathroom. Doing well, pain controlled, will see for second session   Follow Up Recommendations  Follow surgeon's recommendation for DC plan and follow-up therapies     Equipment Recommendations  None recommended by PT    Recommendations for Other Services       Precautions / Restrictions Precautions Precautions: Fall;Knee Restrictions Weight Bearing Restrictions: No Other Position/Activity Restrictions: WBAT    Mobility  Bed Mobility Overal bed mobility: Needs Assistance Bed Mobility: Supine to Sit     Supine to sit: Supervision     General bed mobility comments: for safety  Transfers Overall transfer level: Needs assistance Equipment used: Rolling walker (2 wheeled) Transfers: Sit to/from Stand Sit to Stand: Min guard         General transfer comment: cues for hand placement  Ambulation/Gait Ambulation/Gait assistance: Min guard Gait Distance (Feet): 12 Feet Assistive device: Rolling walker (2 wheeled) Gait Pattern/deviations: Step-to pattern;Decreased stance time - right     General Gait Details: cues for sequence   Stairs             Wheelchair Mobility    Modified Rankin (Stroke Patients Only)       Balance                                            Cognition Arousal/Alertness: Awake/alert Behavior During Therapy: WFL for tasks assessed/performed Overall Cognitive Status: Within Functional Limits for tasks assessed                                        Exercises      General Comments        Pertinent Vitals/Pain Pain Assessment: 0-10 Pain Score: 0-No pain Pain Location: right knee    Home Living                       Prior Function            PT Goals (current goals can now be found in the care plan section) Acute Rehab PT Goals PT Goal Formulation: With patient Time For Goal Achievement: 02/19/19 Potential to Achieve Goals: Good Progress towards PT goals: Progressing toward goals    Frequency    7X/week      PT Plan Current plan remains appropriate    Co-evaluation              AM-PAC PT "6 Clicks" Mobility   Outcome Measure  Help needed turning from your back to your side while in a flat bed without using bedrails?: A Little Help needed moving from lying on your back to sitting on the side of a flat bed without using bedrails?: A Little Help needed moving to and from a bed to a chair (including a wheelchair)?: A Little Help needed standing up from a chair using your arms (e.g., wheelchair or bedside chair)?: A Little Help needed to walk in hospital room?: A Little Help needed climbing 3-5 steps with a railing? : A Little 6 Click  Score: 18    End of Session Equipment Utilized During Treatment: Gait belt Activity Tolerance: Patient tolerated treatment well Patient left: Other (comment)(bathroom, RN aware)   PT Visit Diagnosis: Difficulty in walking, not elsewhere classified (R26.2)     Time: [id number] PT Time Calculation (min) (ACUTE ONLY): 10 min  Charges:  $Gait Training: 8-22 mins                     Kenyon Ana, PT  Pager: [phone] Acute Rehab Dept [facility]): [phone]   02/13/2019    [facility] 02/13/2019, 10:14 AM

## 2019-02-13 NOTE — Progress Notes (Signed)
   02/13/19 1300  PT Visit Information  Last PT Received On 02/13/19 pt progressing very well. Ready for d/c from PT standpoint  Assistance Needed +1  History of Present Illness 73 yo female s/p R TKA; PMH: L TKA 2019, shingles/post herpetic neuralgia  Precautions  Precautions Fall;Knee  Restrictions  Weight Bearing Restrictions No  Other Position/Activity Restrictions WBAT  Pain Assessment  Pain Location right knee  Cognition  Arousal/Alertness Awake/alert  Behavior During Therapy WFL for tasks assessed/performed  Overall Cognitive Status Within Functional Limits for tasks assessed  Bed Mobility  Overal bed mobility Needs Assistance  Bed Mobility Supine to Sit  Supine to sit Modified independent (Device/Increase time)  Transfers  Overall transfer level Needs assistance  Equipment used Rolling walker (2 wheeled)  Transfers Sit to/from Stand  Sit to Stand Supervision  General transfer comment cues for hand placement  Ambulation/Gait  Ambulation/Gait assistance Supervision  Gait Distance (Feet) 100 Feet  Assistive device Rolling walker (2 wheeled)  Gait Pattern/deviations Step-to pattern;Decreased stance time - right  General Gait Details cues for sequence  Stairs Yes  Stairs assistance Min guard  Stair Management One rail Right;One rail Left;Step to pattern;Sideways  Number of Stairs 5 (x2)  General stair comments cues for sequence   Total Joint Exercises  Ankle Circles/Pumps AROM;Both;10 reps  Quad Sets AROM;Both;10 reps  Heel Slides AROM;Right;10 reps  Hip ABduction/ADduction AROM;Right;10 reps  Short Arc Quad AROM;10 reps;Strengthening;Right  Straight Leg Raises AROM;Strengthening;Right;10 reps  PT - End of Session  Equipment Utilized During Treatment Gait belt  Activity Tolerance Patient tolerated treatment well  Patient left in bed;with call bell/phone within reach;with bed alarm set (bathroom, RN aware)   PT - Assessment/Plan  PT Plan Current plan remains  appropriate  PT Visit Diagnosis Difficulty in walking, not elsewhere classified (R26.2)  PT Frequency (ACUTE ONLY) 7X/week  Follow Up Recommendations Follow surgeon's recommendation for DC plan and follow-up therapies  PT equipment None recommended by PT  AM-PAC PT "6 Clicks" Mobility Outcome Measure (Version 2)  Help needed turning from your back to your side while in a flat bed without using bedrails? 3  Help needed moving from lying on your back to sitting on the side of a flat bed without using bedrails? 3  Help needed moving to and from a bed to a chair (including a wheelchair)? 3  Help needed standing up from a chair using your arms (e.g., wheelchair or bedside chair)? 3  Help needed to walk in hospital room? 3  Help needed climbing 3-5 steps with a railing?  3  6 Click Score 18  Consider Recommendation of Discharge To: Home with [facility]  PT Goal Progression  Progress towards PT goals Progressing toward goals  Acute Rehab PT Goals  PT Goal Formulation With patient  Time For Goal Achievement 02/19/19  Potential to Achieve Goals Good  PT Time Calculation  PT Start Time (ACUTE ONLY) 1214  PT Stop Time (ACUTE ONLY) 1234  PT Time Calculation (min) (ACUTE ONLY) 20 min  PT General Charges  $$ ACUTE PT VISIT 1 Visit  PT Treatments  $Gait Training 8-22 mins

## 2019-02-13 NOTE — Progress Notes (Signed)
Therapy Plan: OPPT DME-arranged post op

## 2019-02-13 NOTE — Progress Notes (Signed)
Pt was provided with d/c instructions. After discussing the pt's plan of care upon d/c home, the pt reported no further questions or concerns.  

## 2019-02-13 NOTE — Discharge Summary (Signed)
SPORTS MEDICINE & JOINT REPLACEMENT   Lara Mulch, MD   Carlyon Shadow, PA-C [place], [place], [place]  [address]                             [phone]  PATIENT ID: OLUWATAMILORE MARTELLO        MRN:  [id number]          DOB/AGE: [birth year] / 73 y.o.    DISCHARGE SUMMARY  ADMISSION DATE:    02/12/2019 DISCHARGE DATE:   02/13/2019   ADMISSION DIAGNOSIS: RT. KNEE OSTEOARTHRITIS    DISCHARGE DIAGNOSIS:  RT. KNEE OSTEOARTHRITIS    ADDITIONAL DIAGNOSIS: Active Problems:   S/P total knee replacement  Past Medical History:  Diagnosis Date  . Atrophic vaginitis   . 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 Diverticulosis 04/01/2014   Sigmoid and ascending colon  . History of chicken pox   . History of shingles 12/2004  . Hyperlipidemia   . Hyponatremia 10/12/2015  . Multilevel degenerative disc disease 04/29/2015   knees, back  . OA (osteoarthritis)    TMJ  . Osteopenia 08/22/2013  . Postherpetic neuralgia   . 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 Toe fracture, left 07/2011  . Vitamin D deficiency     PROCEDURE: Procedure(s): TOTAL KNEE ARTHROPLASTY on 02/12/2019  CONSULTS:    HISTORY:  See H&P in chart  HOSPITAL COURSE:  AMBRY AUZENNE is a 73 y.o. admitted on 02/12/2019 and found to have a diagnosis of RT. KNEE OSTEOARTHRITIS.  After appropriate laboratory studies were obtained  they were taken to the operating room on 02/12/2019 and underwent Procedure(s): TOTAL KNEE ARTHROPLASTY.   They were given perioperative antibiotics:  Anti-infectives (From admission, onward)   Start     Dose/Rate Route Frequency Ordered Stop   02/12/19 1330  ceFAZolin (ANCEF) IVPB 2g/100 mL premix     2 g 200 mL/hr over 30 Minutes Intravenous Every 6 hours 02/12/19 1031 02/12/19 2014   02/12/19 0615  ceFAZolin (ANCEF) IVPB 2g/100 mL premix     2 g 200 mL/hr over 30 Minutes Intravenous On call to O.R. 02/12/19 0600 02/12/19 0726    .  Patient given tranexamic acid IV or topical and exparel  intra-operatively.  Tolerated the procedure well.    POD# 1: Vital signs were stable.  Patient denied Chest pain, shortness of breath, or calf pain.  Patient was started on Aspirin twice daily at 8am.  Consults to PT, OT, and care management were made.  The patient was weight bearing as tolerated.  CPM was placed on the operative leg 0-90 degrees for 6-8 hours a day. When out of the CPM, patient was placed in the foam block to achieve full extension. Incentive spirometry was taught.  Dressing was changed.       POD #2, Continued  PT for ambulation and exercise program.  IV saline locked.  O2 discontinued.    The remainder of the hospital course was dedicated to ambulation and strengthening.   The patient was discharged on 1 Day Post-Op in  Good condition.  Blood products given:none  DIAGNOSTIC STUDIES: Recent vital signs:  Patient Vitals for the past 24 hrs:  BP Temp Temp src Pulse Resp SpO2  02/13/19 0508 131/63 (!) 97.3 F (36.3 C) Oral 69 16 98 %  02/13/19 0114 123/66 97.6 F (36.4 C) Oral 74 16 100 %  02/12/19 2130 123/63 (!) 97.4 F (36.3 C) Oral 65 -  100 %  02/12/19 1735 109/66 (!) 97.5 F (36.4 C) Oral 67 18 100 %  02/12/19 1333 118/69 (!) 97.4 F (36.3 C) - 71 17 100 %  02/12/19 1230 118/74 97.8 F (36.6 C) - 69 17 100 %  02/12/19 1131 120/64 (!) 97.5 F (36.4 C) - 70 18 100 %  02/12/19 1031 120/66 97.6 F (36.4 C) - - 16 100 %  02/12/19 1015 124/71 98 F (36.7 C) - - - -  02/12/19 1000 121/64 - - 61 12 (!) 85 %  02/12/19 0945 126/76 - - 63 12 100 %  02/12/19 0930 121/64 - - 65 (!) 9 100 %  02/12/19 0915 121/63 - - 63 17 99 %  02/12/19 0900 116/66 - - 71 12 100 %  02/12/19 0848 (!) 104/58 (!) 97.5 F (36.4 C) - 73 12 100 %       Recent laboratory studies: Recent Labs    02/06/19 0844 02/13/19 0305  WBC 4.4 9.3  HGB 13.4 10.4*  HCT 40.6 32.0*  PLT 302 253   Recent Labs    02/06/19 0844 02/13/19 0305  NA 134* 133*  K 4.0 3.9  CL 100 102  CO2 26 26   BUN 12 7*  CREATININE 0.64 0.54  GLUCOSE 78 111*  CALCIUM 8.9 8.2*   No results found for: INR, PROTIME   Recent Radiographic Studies :  No results found.  DISCHARGE INSTRUCTIONS: Discharge Instructions    Call MD / Call 911   Complete by: As directed    If you experience chest pain or shortness of breath, CALL 911 and be transported to the hospital emergency room.  If you develope a fever above 101 F, pus (white drainage) or increased drainage or redness at the wound, or calf pain, call your surgeon's office.   Constipation Prevention   Complete by: As directed    Drink plenty of fluids.  Prune juice may be helpful.  You may use a stool softener, such as Colace (over the counter) 100 mg twice a day.  Use MiraLax (over the counter) for constipation as needed.   Diet - low sodium heart healthy   Complete by: As directed    Discharge instructions   Complete by: As directed    INSTRUCTIONS AFTER JOINT REPLACEMENT   Remove items at home which could result in a fall. This includes throw rugs or furniture in walking pathways ICE to the affected joint every three hours while awake for 30 minutes at a time, for at least the first 3-5 days, and then as needed for pain and swelling.  Continue to use ice for pain and swelling. You may notice swelling that will progress down to the foot and ankle.  This is normal after surgery.  Elevate your leg when you are not up walking on it.   Continue to use the breathing machine you got in the hospital (incentive spirometer) which will help keep your temperature down.  It is common for your temperature to cycle up and down following surgery, especially at night when you are not up moving around and exerting yourself.  The breathing machine keeps your lungs expanded and your temperature down.   DIET:  As you were doing prior to hospitalization, we recommend a well-balanced diet.  DRESSING / WOUND CARE / SHOWERING  Keep the surgical dressing until follow  up.  The dressing is water proof, so you can shower without any extra covering.  IF THE DRESSING FALLS  OFF or the wound gets wet inside, change the dressing with sterile gauze.  Please use good hand washing techniques before changing the dressing.  Do not use any lotions or creams on the incision until instructed by your surgeon.    ACTIVITY  Increase activity slowly as tolerated, but follow the weight bearing instructions below.   No driving for 6 weeks or until further direction given by your physician.  You cannot drive while taking narcotics.  No lifting or carrying greater than 10 lbs. until further directed by your surgeon. Avoid periods of inactivity such as sitting longer than an hour when not asleep. This helps prevent blood clots.  You may return to work once you are authorized by your doctor.     WEIGHT BEARING   Weight bearing as tolerated with assist device (walker, cane, etc) as directed, use it as long as suggested by your surgeon or therapist, typically at least 4-6 weeks.   EXERCISES  Results after joint replacement surgery are often greatly improved when you follow the exercise, range of motion and muscle strengthening exercises prescribed by your doctor. Safety measures are also important to protect the joint from further injury. Any time any of these exercises cause you to have increased pain or swelling, decrease what you are doing until you are comfortable again and then slowly increase them. If you have problems or questions, call your caregiver or physical therapist for advice.   Rehabilitation is important following a joint replacement. After just a few days of immobilization, the muscles of the leg can become weakened and shrink (atrophy).  These exercises are designed to build up the tone and strength of the thigh and leg muscles and to improve motion. Often times heat used for twenty to thirty minutes before working out will loosen up your tissues and help with  improving the range of motion but do not use heat for the first two weeks following surgery (sometimes heat can increase post-operative swelling).   These exercises can be done on a training (exercise) mat, on the floor, on a table or on a bed. Use whatever works the best and is most comfortable for you.    Use music or television while you are exercising so that the exercises are a pleasant break in your day. This will make your life better with the exercises acting as a break in your routine that you can look forward to.   Perform all exercises about fifteen times, three times per day or as directed.  You should exercise both the operative leg and the other leg as well.   Exercises include:   Quad Sets - Tighten up the muscle on the front of the thigh (Quad) and hold for 5-10 seconds.   Straight Leg Raises - With your knee straight (if you were given a brace, keep it on), lift the leg to 60 degrees, hold for 3 seconds, and slowly lower the leg.  Perform this exercise against resistance later as your leg gets stronger.  Leg Slides: Lying on your back, slowly slide your foot toward your buttocks, bending your knee up off the floor (only go as far as is comfortable). Then slowly slide your foot back down until your leg is flat on the floor again.  Angel Wings: Lying on your back spread your legs to the side as far apart as you can without causing discomfort.  Hamstring Strength:  Lying on your back, push your heel against the floor with your leg straight  by tightening up the muscles of your buttocks.  Repeat, but this time bend your knee to a comfortable angle, and push your heel against the floor.  You may put a pillow under the heel to make it more comfortable if necessary.   A rehabilitation program following joint replacement surgery can speed recovery and prevent re-injury in the future due to weakened muscles. Contact your doctor or a physical therapist for more information on knee rehabilitation.     CONSTIPATION  Constipation is defined medically as fewer than three stools per week and severe constipation as less than one stool per week.  Even if you have a regular bowel pattern at home, your normal regimen is likely to be disrupted due to multiple reasons following surgery.  Combination of anesthesia, postoperative narcotics, change in appetite and fluid intake all can affect your bowels.   YOU MUST use at least one of the following options; they are listed in order of increasing strength to get the job done.  They are all available over the counter, and you may need to use some, POSSIBLY even all of these options:    Drink plenty of fluids (prune juice may be helpful) and high fiber foods Colace 100 mg by mouth twice a day  Senokot for constipation as directed and as needed Dulcolax (bisacodyl), take with full glass of water  Miralax (polyethylene glycol) once or twice a day as needed.  If you have tried all these things and are unable to have a bowel movement in the first 3-4 days after surgery call either your surgeon or your primary doctor.    If you experience loose stools or diarrhea, hold the medications until you stool forms back up.  If your symptoms do not get better within 1 week or if they get worse, check with your doctor.  If you experience "the worst abdominal pain ever" or develop nausea or vomiting, please contact the office immediately for further recommendations for treatment.   ITCHING:  If you experience itching with your medications, try taking only a single pain pill, or even half a pain pill at a time.  You can also use Benadryl over the counter for itching or also to help with sleep.   TED HOSE STOCKINGS:  Use stockings on both legs until for at least 2 weeks or as directed by physician office. They may be removed at night for sleeping.  MEDICATIONS:  See your medication summary on the "After Visit Summary" that nursing will review with you.  You may have some  home medications which will be placed on hold until you complete the course of blood thinner medication.  It is important for you to complete the blood thinner medication as prescribed.  PRECAUTIONS:  If you experience chest pain or shortness of breath - call 911 immediately for transfer to the hospital emergency department.   If you develop a fever greater that 101 F, purulent drainage from wound, increased redness or drainage from wound, foul odor from the wound/dressing, or calf pain - CONTACT YOUR SURGEON.                                                   FOLLOW-UP APPOINTMENTS:  If you do not already have a post-op appointment, please call the office for an appointment to be seen by your surgeon.  Guidelines for how soon to be seen are listed in your "After Visit Summary", but are typically between 1-4 weeks after surgery.  OTHER INSTRUCTIONS:   Knee Replacement:  Do not place pillow under knee, focus on keeping the knee straight while resting. CPM instructions: 0-90 degrees, 2 hours in the morning, 2 hours in the afternoon, and 2 hours in the evening. Place foam block, curve side up under heel at all times except when in CPM or when walking.  DO NOT modify, tear, cut, or change the foam block in any way.  MAKE SURE YOU:  Understand these instructions.  Get help right away if you are not doing well or get worse.    Thank you for letting us be a part of your medical care team.  It is a privilege we respect greatly.  We hope these instructions will help you stay on track for a fast and full recovery!   Increase activity slowly as tolerated   Complete by: As directed       DISCHARGE MEDICATIONS:   Allergies as of 02/13/2019   No Known Allergies     Medication List    STOP taking these medications   ibuprofen 200 MG tablet Commonly known as: ADVIL     TAKE these medications   aspirin 325 MG EC tablet Take 1 tablet (325 mg total) by mouth 2 (two) times daily.    CALCIUM-MAGNESIUM-VITAMIN D PO Take 30 mLs by mouth daily.   Dialyvite Vitamin D 5000 125 MCG (5000 UT) capsule Generic drug: Cholecalciferol Take 10,000 Units by mouth daily.   FISH OIL PO Take 1 Dose by mouth daily.   GLUCOSAMINE PO Take 1 tablet by mouth 4 (four) times a week.   methocarbamol 500 MG tablet Commonly known as: ROBAXIN Take 1-2 tablets (500-1,000 mg total) by mouth every 6 (six) hours as needed for muscle spasms.   mirtazapine 15 MG tablet Commonly known as: REMERON Take 15 mg by mouth at bedtime.   oxyCODONE 5 MG immediate release tablet Commonly known as: Oxy IR/ROXICODONE Take 1-2 tablets (5-10 mg total) by mouth every 6 (six) hours as needed for moderate pain (pain score 4-6).   PROBIOTIC PO Take 1 capsule by mouth 4 (four) times a week.   simvastatin 20 MG tablet Commonly known as: ZOCOR TAKE 1 TABLET BY MOUTH EVERY DAY IN THE EVENING What changed:   how much to take  how to take this  when to take this   Systane 0.4-0.3 % Soln Generic drug: Polyethyl Glycol-Propyl Glycol Place 1 drop into both eyes 3 (three) times daily as needed (for dry eyes).   vitamin C 1000 MG tablet Take 1,000 mg by mouth daily.   Vitamin-B Complex Tabs Take 1 tablet by mouth daily.            Durable Medical Equipment  (From admission, onward)         Start     Ordered   02/12/19 1032  DME Walker rolling  Once    Question:  Patient needs a walker to treat with the following condition  Answer:  S/P total knee replacement   02/12/19 1031   02/12/19 1032  DME 3 n 1  Once     02/12/19 1031   02/12/19 1032  DME Bedside commode  Once    Question:  Patient needs a bedside commode to treat with the following condition  Answer:  S/P total knee replacement   02/12/19 1031  FOLLOW UP VISIT:    DISPOSITION: HOME VS. SNF  CONDITION:  Good   Donia Ast 02/13/2019, 7:16 AM

## 2019-02-13 NOTE — Progress Notes (Signed)
SPORTS MEDICINE AND JOINT REPLACEMENT  Lara Mulch, MD    Carlyon Shadow, PA-C [place], [place], [place]  [address]                             [phone]   PROGRESS NOTE  Subjective:  negative for Chest Pain  negative for Shortness of Breath  negative for Nausea/Vomiting   negative for Calf Pain  negative for Bowel Movement   Tolerating Diet: yes         Patient reports pain as 4 on 0-10 scale.    Objective: Vital signs in last 24 hours:    Patient Vitals for the past 24 hrs:  BP Temp Temp src Pulse Resp SpO2  02/13/19 0508 131/63 (!) 97.3 F (36.3 C) Oral 69 16 98 %  02/13/19 0114 123/66 97.6 F (36.4 C) Oral 74 16 100 %  02/12/19 2130 123/63 (!) 97.4 F (36.3 C) Oral 65 - 100 %  02/12/19 1735 109/66 (!) 97.5 F (36.4 C) Oral 67 18 100 %  02/12/19 1333 118/69 (!) 97.4 F (36.3 C) - 71 17 100 %  02/12/19 1230 118/74 97.8 F (36.6 C) - 69 17 100 %  02/12/19 1131 120/64 (!) 97.5 F (36.4 C) - 70 18 100 %  02/12/19 1031 120/66 97.6 F (36.4 C) - - 16 100 %  02/12/19 1015 124/71 98 F (36.7 C) - - - -  02/12/19 1000 121/64 - - 61 12 (!) 85 %  02/12/19 0945 126/76 - - 63 12 100 %  02/12/19 0930 121/64 - - 65 (!) 9 100 %  02/12/19 0915 121/63 - - 63 17 99 %  02/12/19 0900 116/66 - - 71 12 100 %  02/12/19 0848 (!) 104/58 (!) 97.5 F (36.4 C) - 73 12 100 %    @flow {1959:LAST@   Intake/Output from previous day:   11/02 0701 - 11/03 0700 In: 3838.8 [P.O.:1260; I.V.:2478.8] Out: 3725 [id number]   Intake/Output this shift:   No intake/output data recorded.   Intake/Output      11/02 0701 - 11/03 0700 11/03 0701 - 11/04 0700   P.O. 1260    I.V. (mL/kg) 2478.8 (40.1)    IV Piggyback 100    Total Intake(mL/kg) 3838.8 (62.1)    Urine (mL/kg/hr) 3725 (2.5)    Total Output 3725    Net +113.8            LABORATORY DATA: Recent Labs    02/06/19 0844 02/13/19 0305  WBC 4.4 9.3  HGB 13.4 10.4*  HCT 40.6 32.0*  PLT 302 253   Recent Labs   02/06/19 0844 02/13/19 0305  NA 134* 133*  K 4.0 3.9  CL 100 102  CO2 26 26  BUN 12 7*  CREATININE 0.64 0.54  GLUCOSE 78 111*  CALCIUM 8.9 8.2*   No results found for: INR, PROTIME  Examination:  General appearance: alert, cooperative and no distress Extremities: extremities normal, atraumatic, no cyanosis or edema  Wound Exam: clean, dry, intact   Drainage:  None: wound tissue dry  Motor Exam: Quadriceps and Hamstrings Intact  Sensory Exam: Superficial Peroneal, Deep Peroneal and Tibial normal   Assessment:    1 Day Post-Op  Procedure(s) (LRB): TOTAL KNEE ARTHROPLASTY (Right)  ADDITIONAL DIAGNOSIS:  Active Problems:   S/P total knee replacement     Plan: Physical Therapy as ordered Weight Bearing as Tolerated (WBAT)  DVT Prophylaxis:  Aspirin  DISCHARGE PLAN: Home   Patient doing well, expect D/C home today      Patient's anticipated LOS is less than 2 midnights, meeting these requirements: - Lives within 1 hour of care - Has a competent adult at home to recover with post-op recover - NO history of  - Chronic pain requiring opiods  - Diabetes  - Coronary Artery Disease  - Heart failure  - Heart attack  - Stroke  - DVT/VTE  - Cardiac arrhythmia  - Respiratory Failure/COPD  - Renal failure  - Anemia  - Advanced Liver disease        Donia Ast 02/13/2019, 7:13 AM

## 2019-02-19 DIAGNOSIS — M25662 Stiffness of left knee, not elsewhere classified: Secondary | ICD-10-CM | POA: Diagnosis not present

## 2019-02-19 DIAGNOSIS — R269 Unspecified abnormalities of gait and mobility: Secondary | ICD-10-CM | POA: Diagnosis not present

## 2019-02-19 DIAGNOSIS — Z96652 Presence of left artificial knee joint: Secondary | ICD-10-CM | POA: Diagnosis not present

## 2019-02-19 DIAGNOSIS — R531 Weakness: Secondary | ICD-10-CM | POA: Diagnosis not present

## 2019-02-21 DIAGNOSIS — Z96652 Presence of left artificial knee joint: Secondary | ICD-10-CM | POA: Diagnosis not present

## 2019-02-21 DIAGNOSIS — R269 Unspecified abnormalities of gait and mobility: Secondary | ICD-10-CM | POA: Diagnosis not present

## 2019-02-21 DIAGNOSIS — R531 Weakness: Secondary | ICD-10-CM | POA: Diagnosis not present

## 2019-02-21 DIAGNOSIS — M25662 Stiffness of left knee, not elsewhere classified: Secondary | ICD-10-CM | POA: Diagnosis not present

## 2019-02-22 DIAGNOSIS — Z96652 Presence of left artificial knee joint: Secondary | ICD-10-CM | POA: Diagnosis not present

## 2019-02-22 DIAGNOSIS — Z96651 Presence of right artificial knee joint: Secondary | ICD-10-CM | POA: Diagnosis not present

## 2019-02-23 DIAGNOSIS — Z96652 Presence of left artificial knee joint: Secondary | ICD-10-CM | POA: Diagnosis not present

## 2019-02-23 DIAGNOSIS — R531 Weakness: Secondary | ICD-10-CM | POA: Diagnosis not present

## 2019-02-23 DIAGNOSIS — M25662 Stiffness of left knee, not elsewhere classified: Secondary | ICD-10-CM | POA: Diagnosis not present

## 2019-02-23 DIAGNOSIS — R269 Unspecified abnormalities of gait and mobility: Secondary | ICD-10-CM | POA: Diagnosis not present

## 2019-02-26 DIAGNOSIS — R531 Weakness: Secondary | ICD-10-CM | POA: Diagnosis not present

## 2019-02-26 DIAGNOSIS — M25662 Stiffness of left knee, not elsewhere classified: Secondary | ICD-10-CM | POA: Diagnosis not present

## 2019-02-26 DIAGNOSIS — R269 Unspecified abnormalities of gait and mobility: Secondary | ICD-10-CM | POA: Diagnosis not present

## 2019-02-26 DIAGNOSIS — Z96652 Presence of left artificial knee joint: Secondary | ICD-10-CM | POA: Diagnosis not present

## 2019-02-27 DIAGNOSIS — F39 Unspecified mood [affective] disorder: Secondary | ICD-10-CM | POA: Diagnosis not present

## 2019-02-27 DIAGNOSIS — M179 Osteoarthritis of knee, unspecified: Secondary | ICD-10-CM | POA: Diagnosis not present

## 2019-02-27 DIAGNOSIS — F411 Generalized anxiety disorder: Secondary | ICD-10-CM | POA: Diagnosis not present

## 2019-02-28 DIAGNOSIS — R531 Weakness: Secondary | ICD-10-CM | POA: Diagnosis not present

## 2019-02-28 DIAGNOSIS — R269 Unspecified abnormalities of gait and mobility: Secondary | ICD-10-CM | POA: Diagnosis not present

## 2019-02-28 DIAGNOSIS — Z96652 Presence of left artificial knee joint: Secondary | ICD-10-CM | POA: Diagnosis not present

## 2019-02-28 DIAGNOSIS — M25662 Stiffness of left knee, not elsewhere classified: Secondary | ICD-10-CM | POA: Diagnosis not present

## 2019-03-02 DIAGNOSIS — R269 Unspecified abnormalities of gait and mobility: Secondary | ICD-10-CM | POA: Diagnosis not present

## 2019-03-02 DIAGNOSIS — R531 Weakness: Secondary | ICD-10-CM | POA: Diagnosis not present

## 2019-03-02 DIAGNOSIS — M25662 Stiffness of left knee, not elsewhere classified: Secondary | ICD-10-CM | POA: Diagnosis not present

## 2019-03-02 DIAGNOSIS — Z96652 Presence of left artificial knee joint: Secondary | ICD-10-CM | POA: Diagnosis not present

## 2019-03-05 DIAGNOSIS — M25662 Stiffness of left knee, not elsewhere classified: Secondary | ICD-10-CM | POA: Diagnosis not present

## 2019-03-05 DIAGNOSIS — R269 Unspecified abnormalities of gait and mobility: Secondary | ICD-10-CM | POA: Diagnosis not present

## 2019-03-05 DIAGNOSIS — Z96652 Presence of left artificial knee joint: Secondary | ICD-10-CM | POA: Diagnosis not present

## 2019-03-05 DIAGNOSIS — R531 Weakness: Secondary | ICD-10-CM | POA: Diagnosis not present

## 2019-03-07 DIAGNOSIS — Z96652 Presence of left artificial knee joint: Secondary | ICD-10-CM | POA: Diagnosis not present

## 2019-03-07 DIAGNOSIS — M25662 Stiffness of left knee, not elsewhere classified: Secondary | ICD-10-CM | POA: Diagnosis not present

## 2019-03-07 DIAGNOSIS — R531 Weakness: Secondary | ICD-10-CM | POA: Diagnosis not present

## 2019-03-07 DIAGNOSIS — R269 Unspecified abnormalities of gait and mobility: Secondary | ICD-10-CM | POA: Diagnosis not present

## 2019-03-12 DIAGNOSIS — M25562 Pain in left knee: Secondary | ICD-10-CM | POA: Diagnosis not present

## 2019-03-12 DIAGNOSIS — M25662 Stiffness of left knee, not elsewhere classified: Secondary | ICD-10-CM | POA: Diagnosis not present

## 2019-03-12 DIAGNOSIS — R531 Weakness: Secondary | ICD-10-CM | POA: Diagnosis not present

## 2019-03-12 DIAGNOSIS — R269 Unspecified abnormalities of gait and mobility: Secondary | ICD-10-CM | POA: Diagnosis not present

## 2019-03-14 DIAGNOSIS — R269 Unspecified abnormalities of gait and mobility: Secondary | ICD-10-CM | POA: Diagnosis not present

## 2019-03-14 DIAGNOSIS — M25662 Stiffness of left knee, not elsewhere classified: Secondary | ICD-10-CM | POA: Diagnosis not present

## 2019-03-14 DIAGNOSIS — R531 Weakness: Secondary | ICD-10-CM | POA: Diagnosis not present

## 2019-03-14 DIAGNOSIS — M25562 Pain in left knee: Secondary | ICD-10-CM | POA: Diagnosis not present

## 2019-03-15 DIAGNOSIS — R269 Unspecified abnormalities of gait and mobility: Secondary | ICD-10-CM | POA: Diagnosis not present

## 2019-03-15 DIAGNOSIS — M25562 Pain in left knee: Secondary | ICD-10-CM | POA: Diagnosis not present

## 2019-03-15 DIAGNOSIS — M25662 Stiffness of left knee, not elsewhere classified: Secondary | ICD-10-CM | POA: Diagnosis not present

## 2019-03-15 DIAGNOSIS — R531 Weakness: Secondary | ICD-10-CM | POA: Diagnosis not present

## 2019-03-16 DIAGNOSIS — R269 Unspecified abnormalities of gait and mobility: Secondary | ICD-10-CM | POA: Diagnosis not present

## 2019-03-16 DIAGNOSIS — M25662 Stiffness of left knee, not elsewhere classified: Secondary | ICD-10-CM | POA: Diagnosis not present

## 2019-03-16 DIAGNOSIS — R531 Weakness: Secondary | ICD-10-CM | POA: Diagnosis not present

## 2019-03-16 DIAGNOSIS — M25562 Pain in left knee: Secondary | ICD-10-CM | POA: Diagnosis not present

## 2019-03-19 DIAGNOSIS — R531 Weakness: Secondary | ICD-10-CM | POA: Diagnosis not present

## 2019-03-19 DIAGNOSIS — M25562 Pain in left knee: Secondary | ICD-10-CM | POA: Diagnosis not present

## 2019-03-19 DIAGNOSIS — R269 Unspecified abnormalities of gait and mobility: Secondary | ICD-10-CM | POA: Diagnosis not present

## 2019-03-19 DIAGNOSIS — M25662 Stiffness of left knee, not elsewhere classified: Secondary | ICD-10-CM | POA: Diagnosis not present

## 2019-03-21 DIAGNOSIS — M25562 Pain in left knee: Secondary | ICD-10-CM | POA: Diagnosis not present

## 2019-03-21 DIAGNOSIS — M25662 Stiffness of left knee, not elsewhere classified: Secondary | ICD-10-CM | POA: Diagnosis not present

## 2019-03-21 DIAGNOSIS — R531 Weakness: Secondary | ICD-10-CM | POA: Diagnosis not present

## 2019-03-21 DIAGNOSIS — R269 Unspecified abnormalities of gait and mobility: Secondary | ICD-10-CM | POA: Diagnosis not present

## 2019-03-23 DIAGNOSIS — M25562 Pain in left knee: Secondary | ICD-10-CM | POA: Diagnosis not present

## 2019-03-23 DIAGNOSIS — M25662 Stiffness of left knee, not elsewhere classified: Secondary | ICD-10-CM | POA: Diagnosis not present

## 2019-03-23 DIAGNOSIS — R269 Unspecified abnormalities of gait and mobility: Secondary | ICD-10-CM | POA: Diagnosis not present

## 2019-03-23 DIAGNOSIS — R531 Weakness: Secondary | ICD-10-CM | POA: Diagnosis not present

## 2019-03-28 DIAGNOSIS — R269 Unspecified abnormalities of gait and mobility: Secondary | ICD-10-CM | POA: Diagnosis not present

## 2019-03-28 DIAGNOSIS — R531 Weakness: Secondary | ICD-10-CM | POA: Diagnosis not present

## 2019-03-28 DIAGNOSIS — M25662 Stiffness of left knee, not elsewhere classified: Secondary | ICD-10-CM | POA: Diagnosis not present

## 2019-03-28 DIAGNOSIS — M25562 Pain in left knee: Secondary | ICD-10-CM | POA: Diagnosis not present

## 2019-03-31 DIAGNOSIS — Z20828 Contact with and (suspected) exposure to other viral communicable diseases: Secondary | ICD-10-CM | POA: Diagnosis not present

## 2019-04-16 DIAGNOSIS — R269 Unspecified abnormalities of gait and mobility: Secondary | ICD-10-CM | POA: Diagnosis not present

## 2019-04-16 DIAGNOSIS — R531 Weakness: Secondary | ICD-10-CM | POA: Diagnosis not present

## 2019-04-16 DIAGNOSIS — M25662 Stiffness of left knee, not elsewhere classified: Secondary | ICD-10-CM | POA: Diagnosis not present

## 2019-04-16 DIAGNOSIS — M25562 Pain in left knee: Secondary | ICD-10-CM | POA: Diagnosis not present

## 2019-04-17 DIAGNOSIS — R531 Weakness: Secondary | ICD-10-CM | POA: Diagnosis not present

## 2019-04-17 DIAGNOSIS — Z96651 Presence of right artificial knee joint: Secondary | ICD-10-CM | POA: Diagnosis not present

## 2019-04-17 DIAGNOSIS — R269 Unspecified abnormalities of gait and mobility: Secondary | ICD-10-CM | POA: Diagnosis not present

## 2019-04-17 DIAGNOSIS — M25561 Pain in right knee: Secondary | ICD-10-CM | POA: Diagnosis not present

## 2019-04-18 DIAGNOSIS — R269 Unspecified abnormalities of gait and mobility: Secondary | ICD-10-CM | POA: Diagnosis not present

## 2019-04-18 DIAGNOSIS — M25561 Pain in right knee: Secondary | ICD-10-CM | POA: Diagnosis not present

## 2019-04-18 DIAGNOSIS — R531 Weakness: Secondary | ICD-10-CM | POA: Diagnosis not present

## 2019-04-18 DIAGNOSIS — Z96651 Presence of right artificial knee joint: Secondary | ICD-10-CM | POA: Diagnosis not present

## 2019-04-20 DIAGNOSIS — Z96651 Presence of right artificial knee joint: Secondary | ICD-10-CM | POA: Diagnosis not present

## 2019-04-20 DIAGNOSIS — R531 Weakness: Secondary | ICD-10-CM | POA: Diagnosis not present

## 2019-04-20 DIAGNOSIS — M25561 Pain in right knee: Secondary | ICD-10-CM | POA: Diagnosis not present

## 2019-04-20 DIAGNOSIS — R269 Unspecified abnormalities of gait and mobility: Secondary | ICD-10-CM | POA: Diagnosis not present

## 2019-04-23 DIAGNOSIS — M25561 Pain in right knee: Secondary | ICD-10-CM | POA: Diagnosis not present

## 2019-04-23 DIAGNOSIS — R269 Unspecified abnormalities of gait and mobility: Secondary | ICD-10-CM | POA: Diagnosis not present

## 2019-04-23 DIAGNOSIS — Z96651 Presence of right artificial knee joint: Secondary | ICD-10-CM | POA: Diagnosis not present

## 2019-04-23 DIAGNOSIS — R531 Weakness: Secondary | ICD-10-CM | POA: Diagnosis not present

## 2019-04-27 DIAGNOSIS — Z96651 Presence of right artificial knee joint: Secondary | ICD-10-CM | POA: Diagnosis not present

## 2019-04-27 DIAGNOSIS — R269 Unspecified abnormalities of gait and mobility: Secondary | ICD-10-CM | POA: Diagnosis not present

## 2019-04-27 DIAGNOSIS — M25561 Pain in right knee: Secondary | ICD-10-CM | POA: Diagnosis not present

## 2019-04-27 DIAGNOSIS — R531 Weakness: Secondary | ICD-10-CM | POA: Diagnosis not present

## 2019-04-30 DIAGNOSIS — R531 Weakness: Secondary | ICD-10-CM | POA: Diagnosis not present

## 2019-04-30 DIAGNOSIS — R269 Unspecified abnormalities of gait and mobility: Secondary | ICD-10-CM | POA: Diagnosis not present

## 2019-04-30 DIAGNOSIS — Z96651 Presence of right artificial knee joint: Secondary | ICD-10-CM | POA: Diagnosis not present

## 2019-04-30 DIAGNOSIS — M25561 Pain in right knee: Secondary | ICD-10-CM | POA: Diagnosis not present

## 2019-05-02 ENCOUNTER — Ambulatory Visit: Payer: PPO | Attending: Internal Medicine

## 2019-05-02 DIAGNOSIS — Z23 Encounter for immunization: Secondary | ICD-10-CM | POA: Insufficient documentation

## 2019-05-02 DIAGNOSIS — R531 Weakness: Secondary | ICD-10-CM | POA: Diagnosis not present

## 2019-05-02 DIAGNOSIS — Z96651 Presence of right artificial knee joint: Secondary | ICD-10-CM | POA: Diagnosis not present

## 2019-05-02 DIAGNOSIS — M25561 Pain in right knee: Secondary | ICD-10-CM | POA: Diagnosis not present

## 2019-05-02 DIAGNOSIS — R269 Unspecified abnormalities of gait and mobility: Secondary | ICD-10-CM | POA: Diagnosis not present

## 2019-05-02 NOTE — Progress Notes (Signed)
   Covid-19 Vaccination Clinic  Name:  YOANDRA PIETILA    MRN: [id number] DOB: 30-Aug-1945  05/02/2019  Ms. Mellott was observed post Covid-19 immunization for 15 minutes without incidence. She was provided with Vaccine Information Sheet and instruction to access the V-Safe system.   Ms. Kuechler was instructed to call 911 with any severe reactions post vaccine: Marland Kitchen Difficulty breathing  . Swelling of your face and throat  . A fast heartbeat  . A bad rash all over your body  . Dizziness and weakness    Immunizations Administered    Name Date Dose VIS Date Route   Pfizer COVID-19 Vaccine 05/02/2019  8:55 AM 0.3 mL 03/23/2019 Intramuscular   Manufacturer: [place]   Lot: [id number]   [place]: [id number]

## 2019-05-03 DIAGNOSIS — M25561 Pain in right knee: Secondary | ICD-10-CM | POA: Diagnosis not present

## 2019-05-03 DIAGNOSIS — R269 Unspecified abnormalities of gait and mobility: Secondary | ICD-10-CM | POA: Diagnosis not present

## 2019-05-03 DIAGNOSIS — R531 Weakness: Secondary | ICD-10-CM | POA: Diagnosis not present

## 2019-05-03 DIAGNOSIS — Z96651 Presence of right artificial knee joint: Secondary | ICD-10-CM | POA: Diagnosis not present

## 2019-05-04 DIAGNOSIS — R531 Weakness: Secondary | ICD-10-CM | POA: Diagnosis not present

## 2019-05-04 DIAGNOSIS — R269 Unspecified abnormalities of gait and mobility: Secondary | ICD-10-CM | POA: Diagnosis not present

## 2019-05-04 DIAGNOSIS — Z96651 Presence of right artificial knee joint: Secondary | ICD-10-CM | POA: Diagnosis not present

## 2019-05-04 DIAGNOSIS — M25561 Pain in right knee: Secondary | ICD-10-CM | POA: Diagnosis not present

## 2019-05-09 DIAGNOSIS — R269 Unspecified abnormalities of gait and mobility: Secondary | ICD-10-CM | POA: Diagnosis not present

## 2019-05-09 DIAGNOSIS — Z96651 Presence of right artificial knee joint: Secondary | ICD-10-CM | POA: Diagnosis not present

## 2019-05-09 DIAGNOSIS — M25561 Pain in right knee: Secondary | ICD-10-CM | POA: Diagnosis not present

## 2019-05-09 DIAGNOSIS — R531 Weakness: Secondary | ICD-10-CM | POA: Diagnosis not present

## 2019-05-11 DIAGNOSIS — Z96651 Presence of right artificial knee joint: Secondary | ICD-10-CM | POA: Diagnosis not present

## 2019-05-11 DIAGNOSIS — R269 Unspecified abnormalities of gait and mobility: Secondary | ICD-10-CM | POA: Diagnosis not present

## 2019-05-11 DIAGNOSIS — M25561 Pain in right knee: Secondary | ICD-10-CM | POA: Diagnosis not present

## 2019-05-11 DIAGNOSIS — R531 Weakness: Secondary | ICD-10-CM | POA: Diagnosis not present

## 2019-05-14 DIAGNOSIS — Z96651 Presence of right artificial knee joint: Secondary | ICD-10-CM | POA: Diagnosis not present

## 2019-05-14 DIAGNOSIS — M25561 Pain in right knee: Secondary | ICD-10-CM | POA: Diagnosis not present

## 2019-05-14 DIAGNOSIS — R531 Weakness: Secondary | ICD-10-CM | POA: Diagnosis not present

## 2019-05-14 DIAGNOSIS — R269 Unspecified abnormalities of gait and mobility: Secondary | ICD-10-CM | POA: Diagnosis not present

## 2019-05-16 DIAGNOSIS — Z96651 Presence of right artificial knee joint: Secondary | ICD-10-CM | POA: Diagnosis not present

## 2019-05-16 DIAGNOSIS — R531 Weakness: Secondary | ICD-10-CM | POA: Diagnosis not present

## 2019-05-16 DIAGNOSIS — R269 Unspecified abnormalities of gait and mobility: Secondary | ICD-10-CM | POA: Diagnosis not present

## 2019-05-16 DIAGNOSIS — M25561 Pain in right knee: Secondary | ICD-10-CM | POA: Diagnosis not present

## 2019-05-21 ENCOUNTER — Ambulatory Visit: Payer: PPO | Attending: Internal Medicine

## 2019-05-21 DIAGNOSIS — Z96651 Presence of right artificial knee joint: Secondary | ICD-10-CM | POA: Diagnosis not present

## 2019-05-21 DIAGNOSIS — Z23 Encounter for immunization: Secondary | ICD-10-CM | POA: Insufficient documentation

## 2019-05-21 DIAGNOSIS — R531 Weakness: Secondary | ICD-10-CM | POA: Diagnosis not present

## 2019-05-21 DIAGNOSIS — M25561 Pain in right knee: Secondary | ICD-10-CM | POA: Diagnosis not present

## 2019-05-21 DIAGNOSIS — R269 Unspecified abnormalities of gait and mobility: Secondary | ICD-10-CM | POA: Diagnosis not present

## 2019-05-21 NOTE — Progress Notes (Signed)
   Covid-19 Vaccination Clinic  Name:  Courtney Shaffer    MRN: [id number] DOB: 25-Jan-1946  05/21/2019  Courtney Shaffer was observed post Covid-19 immunization for 15 minutes without incidence. She was provided with Vaccine Information Sheet and instruction to access the V-Safe system.   Courtney Shaffer was instructed to call 911 with any severe reactions post vaccine: Marland Kitchen Difficulty breathing  . Swelling of your face and throat  . A fast heartbeat  . A bad rash all over your body  . Dizziness and weakness    Immunizations Administered    Name Date Dose VIS Date Route   Pfizer COVID-19 Vaccine 05/21/2019  8:31 AM 0.3 mL 03/23/2019 Intramuscular   Manufacturer: [place]   Lot: [id number]   [place]: [id number]

## 2019-05-25 DIAGNOSIS — M25561 Pain in right knee: Secondary | ICD-10-CM | POA: Diagnosis not present

## 2019-05-25 DIAGNOSIS — R531 Weakness: Secondary | ICD-10-CM | POA: Diagnosis not present

## 2019-05-25 DIAGNOSIS — R269 Unspecified abnormalities of gait and mobility: Secondary | ICD-10-CM | POA: Diagnosis not present

## 2019-05-25 DIAGNOSIS — Z96651 Presence of right artificial knee joint: Secondary | ICD-10-CM | POA: Diagnosis not present

## 2019-05-26 DIAGNOSIS — Z96651 Presence of right artificial knee joint: Secondary | ICD-10-CM | POA: Diagnosis not present

## 2019-05-26 DIAGNOSIS — R269 Unspecified abnormalities of gait and mobility: Secondary | ICD-10-CM | POA: Diagnosis not present

## 2019-05-26 DIAGNOSIS — M25561 Pain in right knee: Secondary | ICD-10-CM | POA: Diagnosis not present

## 2019-05-26 DIAGNOSIS — R531 Weakness: Secondary | ICD-10-CM | POA: Diagnosis not present

## 2019-05-28 DIAGNOSIS — M25561 Pain in right knee: Secondary | ICD-10-CM | POA: Diagnosis not present

## 2019-05-28 DIAGNOSIS — Z96651 Presence of right artificial knee joint: Secondary | ICD-10-CM | POA: Diagnosis not present

## 2019-05-28 DIAGNOSIS — R269 Unspecified abnormalities of gait and mobility: Secondary | ICD-10-CM | POA: Diagnosis not present

## 2019-05-28 DIAGNOSIS — R531 Weakness: Secondary | ICD-10-CM | POA: Diagnosis not present

## 2019-06-01 DIAGNOSIS — M25561 Pain in right knee: Secondary | ICD-10-CM | POA: Diagnosis not present

## 2019-06-01 DIAGNOSIS — R269 Unspecified abnormalities of gait and mobility: Secondary | ICD-10-CM | POA: Diagnosis not present

## 2019-06-01 DIAGNOSIS — R531 Weakness: Secondary | ICD-10-CM | POA: Diagnosis not present

## 2019-06-01 DIAGNOSIS — Z96651 Presence of right artificial knee joint: Secondary | ICD-10-CM | POA: Diagnosis not present

## 2019-06-04 DIAGNOSIS — R531 Weakness: Secondary | ICD-10-CM | POA: Diagnosis not present

## 2019-06-04 DIAGNOSIS — M25561 Pain in right knee: Secondary | ICD-10-CM | POA: Diagnosis not present

## 2019-06-04 DIAGNOSIS — R269 Unspecified abnormalities of gait and mobility: Secondary | ICD-10-CM | POA: Diagnosis not present

## 2019-06-04 DIAGNOSIS — Z96651 Presence of right artificial knee joint: Secondary | ICD-10-CM | POA: Diagnosis not present

## 2019-06-05 ENCOUNTER — Ambulatory Visit: Payer: PPO

## 2019-06-08 DIAGNOSIS — R269 Unspecified abnormalities of gait and mobility: Secondary | ICD-10-CM | POA: Diagnosis not present

## 2019-06-08 DIAGNOSIS — M25561 Pain in right knee: Secondary | ICD-10-CM | POA: Diagnosis not present

## 2019-06-08 DIAGNOSIS — Z96651 Presence of right artificial knee joint: Secondary | ICD-10-CM | POA: Diagnosis not present

## 2019-06-08 DIAGNOSIS — R531 Weakness: Secondary | ICD-10-CM | POA: Diagnosis not present

## 2019-06-13 DIAGNOSIS — R531 Weakness: Secondary | ICD-10-CM | POA: Diagnosis not present

## 2019-06-13 DIAGNOSIS — Z96651 Presence of right artificial knee joint: Secondary | ICD-10-CM | POA: Diagnosis not present

## 2019-06-13 DIAGNOSIS — R269 Unspecified abnormalities of gait and mobility: Secondary | ICD-10-CM | POA: Diagnosis not present

## 2019-06-13 DIAGNOSIS — M25561 Pain in right knee: Secondary | ICD-10-CM | POA: Diagnosis not present

## 2019-06-18 DIAGNOSIS — Z96651 Presence of right artificial knee joint: Secondary | ICD-10-CM | POA: Diagnosis not present

## 2019-06-18 DIAGNOSIS — R531 Weakness: Secondary | ICD-10-CM | POA: Diagnosis not present

## 2019-06-18 DIAGNOSIS — R269 Unspecified abnormalities of gait and mobility: Secondary | ICD-10-CM | POA: Diagnosis not present

## 2019-06-18 DIAGNOSIS — M25561 Pain in right knee: Secondary | ICD-10-CM | POA: Diagnosis not present

## 2019-06-19 DIAGNOSIS — Z96651 Presence of right artificial knee joint: Secondary | ICD-10-CM | POA: Diagnosis not present

## 2019-06-19 DIAGNOSIS — R269 Unspecified abnormalities of gait and mobility: Secondary | ICD-10-CM | POA: Diagnosis not present

## 2019-06-19 DIAGNOSIS — M25561 Pain in right knee: Secondary | ICD-10-CM | POA: Diagnosis not present

## 2019-06-19 DIAGNOSIS — R531 Weakness: Secondary | ICD-10-CM | POA: Diagnosis not present

## 2019-06-20 DIAGNOSIS — Z96651 Presence of right artificial knee joint: Secondary | ICD-10-CM | POA: Diagnosis not present

## 2019-06-20 DIAGNOSIS — M25561 Pain in right knee: Secondary | ICD-10-CM | POA: Diagnosis not present

## 2019-06-20 DIAGNOSIS — R531 Weakness: Secondary | ICD-10-CM | POA: Diagnosis not present

## 2019-06-20 DIAGNOSIS — R269 Unspecified abnormalities of gait and mobility: Secondary | ICD-10-CM | POA: Diagnosis not present

## 2019-06-27 DIAGNOSIS — R531 Weakness: Secondary | ICD-10-CM | POA: Diagnosis not present

## 2019-06-27 DIAGNOSIS — M25561 Pain in right knee: Secondary | ICD-10-CM | POA: Diagnosis not present

## 2019-06-27 DIAGNOSIS — R269 Unspecified abnormalities of gait and mobility: Secondary | ICD-10-CM | POA: Diagnosis not present

## 2019-06-27 DIAGNOSIS — Z96651 Presence of right artificial knee joint: Secondary | ICD-10-CM | POA: Diagnosis not present

## 2019-06-29 DIAGNOSIS — Z96651 Presence of right artificial knee joint: Secondary | ICD-10-CM | POA: Diagnosis not present

## 2019-06-29 DIAGNOSIS — R531 Weakness: Secondary | ICD-10-CM | POA: Diagnosis not present

## 2019-06-29 DIAGNOSIS — R269 Unspecified abnormalities of gait and mobility: Secondary | ICD-10-CM | POA: Diagnosis not present

## 2019-06-29 DIAGNOSIS — M25561 Pain in right knee: Secondary | ICD-10-CM | POA: Diagnosis not present

## 2019-07-02 DIAGNOSIS — R269 Unspecified abnormalities of gait and mobility: Secondary | ICD-10-CM | POA: Diagnosis not present

## 2019-07-02 DIAGNOSIS — R531 Weakness: Secondary | ICD-10-CM | POA: Diagnosis not present

## 2019-07-02 DIAGNOSIS — Z96651 Presence of right artificial knee joint: Secondary | ICD-10-CM | POA: Diagnosis not present

## 2019-07-02 DIAGNOSIS — M25561 Pain in right knee: Secondary | ICD-10-CM | POA: Diagnosis not present

## 2019-07-04 DIAGNOSIS — M25561 Pain in right knee: Secondary | ICD-10-CM | POA: Diagnosis not present

## 2019-07-04 DIAGNOSIS — R531 Weakness: Secondary | ICD-10-CM | POA: Diagnosis not present

## 2019-07-04 DIAGNOSIS — R269 Unspecified abnormalities of gait and mobility: Secondary | ICD-10-CM | POA: Diagnosis not present

## 2019-07-04 DIAGNOSIS — Z96651 Presence of right artificial knee joint: Secondary | ICD-10-CM | POA: Diagnosis not present

## 2019-07-12 DIAGNOSIS — Z96651 Presence of right artificial knee joint: Secondary | ICD-10-CM | POA: Diagnosis not present

## 2019-07-12 DIAGNOSIS — R269 Unspecified abnormalities of gait and mobility: Secondary | ICD-10-CM | POA: Diagnosis not present

## 2019-07-12 DIAGNOSIS — M25561 Pain in right knee: Secondary | ICD-10-CM | POA: Diagnosis not present

## 2019-07-12 DIAGNOSIS — R531 Weakness: Secondary | ICD-10-CM | POA: Diagnosis not present

## 2019-07-25 DIAGNOSIS — R269 Unspecified abnormalities of gait and mobility: Secondary | ICD-10-CM | POA: Diagnosis not present

## 2019-07-25 DIAGNOSIS — Z96651 Presence of right artificial knee joint: Secondary | ICD-10-CM | POA: Diagnosis not present

## 2019-07-25 DIAGNOSIS — R531 Weakness: Secondary | ICD-10-CM | POA: Diagnosis not present

## 2019-07-25 DIAGNOSIS — M25561 Pain in right knee: Secondary | ICD-10-CM | POA: Diagnosis not present

## 2019-07-27 DIAGNOSIS — R269 Unspecified abnormalities of gait and mobility: Secondary | ICD-10-CM | POA: Diagnosis not present

## 2019-07-27 DIAGNOSIS — R531 Weakness: Secondary | ICD-10-CM | POA: Diagnosis not present

## 2019-07-27 DIAGNOSIS — M25561 Pain in right knee: Secondary | ICD-10-CM | POA: Diagnosis not present

## 2019-07-27 DIAGNOSIS — Z96651 Presence of right artificial knee joint: Secondary | ICD-10-CM | POA: Diagnosis not present

## 2019-08-01 DIAGNOSIS — Z96651 Presence of right artificial knee joint: Secondary | ICD-10-CM | POA: Diagnosis not present

## 2019-08-01 DIAGNOSIS — R531 Weakness: Secondary | ICD-10-CM | POA: Diagnosis not present

## 2019-08-01 DIAGNOSIS — R269 Unspecified abnormalities of gait and mobility: Secondary | ICD-10-CM | POA: Diagnosis not present

## 2019-08-01 DIAGNOSIS — M25561 Pain in right knee: Secondary | ICD-10-CM | POA: Diagnosis not present

## 2019-08-08 DIAGNOSIS — R269 Unspecified abnormalities of gait and mobility: Secondary | ICD-10-CM | POA: Diagnosis not present

## 2019-08-08 DIAGNOSIS — Z96651 Presence of right artificial knee joint: Secondary | ICD-10-CM | POA: Diagnosis not present

## 2019-08-08 DIAGNOSIS — M25561 Pain in right knee: Secondary | ICD-10-CM | POA: Diagnosis not present

## 2019-08-08 DIAGNOSIS — R531 Weakness: Secondary | ICD-10-CM | POA: Diagnosis not present

## 2019-08-15 DIAGNOSIS — R269 Unspecified abnormalities of gait and mobility: Secondary | ICD-10-CM | POA: Diagnosis not present

## 2019-08-15 DIAGNOSIS — R531 Weakness: Secondary | ICD-10-CM | POA: Diagnosis not present

## 2019-08-15 DIAGNOSIS — M25561 Pain in right knee: Secondary | ICD-10-CM | POA: Diagnosis not present

## 2019-08-15 DIAGNOSIS — Z96651 Presence of right artificial knee joint: Secondary | ICD-10-CM | POA: Diagnosis not present

## 2019-08-16 ENCOUNTER — Encounter: Payer: Self-pay | Admitting: Physician Assistant

## 2019-08-16 ENCOUNTER — Other Ambulatory Visit: Payer: Self-pay

## 2019-08-16 ENCOUNTER — Ambulatory Visit: Payer: PPO | Admitting: Physician Assistant

## 2019-08-16 DIAGNOSIS — D485 Neoplasm of uncertain behavior of skin: Secondary | ICD-10-CM

## 2019-08-16 DIAGNOSIS — C4492 Squamous cell carcinoma of skin, unspecified: Secondary | ICD-10-CM

## 2019-08-16 DIAGNOSIS — D0472 Carcinoma in situ of skin of left lower limb, including hip: Secondary | ICD-10-CM | POA: Diagnosis not present

## 2019-08-16 DIAGNOSIS — L820 Inflamed seborrheic keratosis: Secondary | ICD-10-CM | POA: Diagnosis not present

## 2019-08-16 HISTORY — DX: Squamous cell carcinoma of skin, unspecified: C44.92

## 2019-08-16 NOTE — Patient Instructions (Signed)

## 2019-08-16 NOTE — Progress Notes (Signed)
   Follow up Visit  Subjective  Courtney Shaffer is a 74 y.o. female who presents for the following: Annual Exam (no new concerns). Has used imiquimod on areas on her legs. New brown spot on right temple. No raised, no itch. Also a pink spot on the left shin that is scaling and has been present a while.    Objective  Well appearing patient in no apparent distress; mood and affect are within normal limits.  A full examination was performed including head, eyes, ears, nose, lips, neck, chest, axillae, abdomen, back, buttocks, bilateral upper extremities, bilateral lower extremities, hands, feet, fingers, toes, fingernails, and toenails. All findings within normal limits unless otherwise noted below. No suspicious moles noted on back.   Objective  Left Lower Leg - Anterior: Pink scaling papule 2.5 cm to scar       Objective  Right Temporal Scalp: Large tan macule 5.5 cm to right upper ear junction       Assessment & Plan  Neoplasm of uncertain behavior of skin (2) Left Lower Leg - Anterior  Skin / nail biopsy Type of biopsy: tangential   Informed consent: discussed and consent obtained   Procedure prep:  Patient was prepped and draped in usual sterile fashion (Non sterile) Prep type:  Chlorhexidine Anesthesia: the lesion was anesthetized in a standard fashion   Anesthetic:  1% lidocaine w/ epinephrine 1-100,000 local infiltration Instrument used: flexible razor blade   Hemostasis achieved with: aluminum chloride   Outcome: patient tolerated procedure well   Post-procedure details: wound care instructions given    Specimen 1 - Surgical pathology Differential Diagnosis: R/O BCC vs SCC Check Margins: No  Right Temporal Scalp  Skin / nail biopsy Type of biopsy: tangential   Informed consent: discussed and consent obtained   Procedure prep:  Patient was prepped and draped in usual sterile fashion (Non sterile) Prep type:  Chlorhexidine Anesthesia: the lesion was  anesthetized in a standard fashion   Anesthetic:  1% lidocaine w/ epinephrine 1-100,000 local infiltration Instrument used: flexible razor blade   Hemostasis achieved with: aluminum chloride   Outcome: patient tolerated procedure well   Post-procedure details: wound care instructions given    Specimen 2 - Surgical pathology Differential Diagnosis: R/O Atypia  Check Margins: No Cautery after biopsy

## 2019-08-20 ENCOUNTER — Encounter: Payer: Self-pay | Admitting: *Deleted

## 2019-08-20 ENCOUNTER — Telehealth: Payer: Self-pay | Admitting: *Deleted

## 2019-08-20 NOTE — Telephone Encounter (Signed)
-----   Message from Arlyss Gandy, PA-C sent at 08/20/2019  8:52 AM EDT ----- 30 min with me for leg

## 2019-08-20 NOTE — Telephone Encounter (Signed)
Pathology results to patient. 54minute with JCB on May 27th at 11:30am.

## 2019-08-20 NOTE — Telephone Encounter (Signed)
Left message for patient to call back  

## 2019-08-28 DIAGNOSIS — R531 Weakness: Secondary | ICD-10-CM | POA: Diagnosis not present

## 2019-08-28 DIAGNOSIS — M25561 Pain in right knee: Secondary | ICD-10-CM | POA: Diagnosis not present

## 2019-08-28 DIAGNOSIS — Z96651 Presence of right artificial knee joint: Secondary | ICD-10-CM | POA: Diagnosis not present

## 2019-08-28 DIAGNOSIS — R269 Unspecified abnormalities of gait and mobility: Secondary | ICD-10-CM | POA: Diagnosis not present

## 2019-08-29 DIAGNOSIS — Z96651 Presence of right artificial knee joint: Secondary | ICD-10-CM | POA: Diagnosis not present

## 2019-08-29 DIAGNOSIS — M25561 Pain in right knee: Secondary | ICD-10-CM | POA: Diagnosis not present

## 2019-08-29 DIAGNOSIS — R531 Weakness: Secondary | ICD-10-CM | POA: Diagnosis not present

## 2019-08-29 DIAGNOSIS — R269 Unspecified abnormalities of gait and mobility: Secondary | ICD-10-CM | POA: Diagnosis not present

## 2019-09-04 DIAGNOSIS — M810 Age-related osteoporosis without current pathological fracture: Secondary | ICD-10-CM | POA: Diagnosis not present

## 2019-09-04 DIAGNOSIS — Z1231 Encounter for screening mammogram for malignant neoplasm of breast: Secondary | ICD-10-CM | POA: Diagnosis not present

## 2019-09-04 DIAGNOSIS — M8589 Other specified disorders of bone density and structure, multiple sites: Secondary | ICD-10-CM | POA: Diagnosis not present

## 2019-09-05 DIAGNOSIS — F411 Generalized anxiety disorder: Secondary | ICD-10-CM | POA: Diagnosis not present

## 2019-09-05 DIAGNOSIS — M179 Osteoarthritis of knee, unspecified: Secondary | ICD-10-CM | POA: Diagnosis not present

## 2019-09-05 DIAGNOSIS — E559 Vitamin D deficiency, unspecified: Secondary | ICD-10-CM | POA: Diagnosis not present

## 2019-09-06 ENCOUNTER — Encounter: Payer: Self-pay | Admitting: Physician Assistant

## 2019-09-06 ENCOUNTER — Other Ambulatory Visit: Payer: Self-pay

## 2019-09-06 ENCOUNTER — Ambulatory Visit (INDEPENDENT_AMBULATORY_CARE_PROVIDER_SITE_OTHER): Payer: PPO | Admitting: Physician Assistant

## 2019-09-06 DIAGNOSIS — D0472 Carcinoma in situ of skin of left lower limb, including hip: Secondary | ICD-10-CM | POA: Diagnosis not present

## 2019-09-06 NOTE — Patient Instructions (Signed)

## 2019-09-06 NOTE — Progress Notes (Signed)
   Follow up Visit  Subjective  Courtney Shaffer is a 74 y.o. female who presents for the following: Procedure.   Follow up Visit  Subjective  Courtney Shaffer is a 74 y.o. female who presents for the following: Procedure.  Objective  Well appearing patient in no apparent distress; mood and affect are within normal limits.  A focused examination was performed including left shin. Relevant physical exam findings are noted in the Assessment and Plan.   Objective  Left Lower Leg - Anterior: Biopsy scar identified with crusted top.  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 Left Lower Leg - Anterior  Destruction of lesion Complexity: simple   Destruction method: electrodesiccation and curettage   Informed consent: discussed and consent obtained   Timeout:  patient name, date of birth, surgical site, and procedure verified Anesthesia: the lesion was anesthetized in a standard fashion   Anesthetic:  1% lidocaine w/ epinephrine 1-100,000 local infiltration Curettage performed in three different directions: Yes   Curettage cycles:  3 Lesion length (cm):  1.5 Lesion width (cm):  1 Margin per side (cm):  0 Final wound size (cm):  1.5 Hemostasis achieved with:  ferric subsulfate Outcome: patient tolerated procedure well with no complications   Post-procedure details: wound care instructions given   Additional details:  Wound innoculated with 5 fluorouracil solution.   Follow up Visit  Subjective  Courtney Shaffer is a 74 y.o. female who presents for the following: Procedure. SCC in situ left shin.   Objective  Well appearing patient in no apparent distress; mood and affect are within normal limits.  A focused examination was performed including left shin. Relevant physical exam findings are noted in the Assessment and Plan.   Objective  Left Lower Leg - Anterior: Biopsy scar identified with crusted top.  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 Left Lower Leg - Anterior  Destruction of lesion Complexity: simple   Destruction method: electrodesiccation and curettage   Informed consent: discussed and consent obtained   Timeout:  patient name, date of birth, surgical site, and procedure verified Anesthesia: the lesion was anesthetized in a standard fashion   Anesthetic:  1% lidocaine w/ epinephrine 1-100,000 local infiltration Curettage performed in three different directions: Yes   Curettage cycles:  3 Lesion length (cm):  1.5 Lesion width (cm):  1 Margin per side (cm):  0 Final wound size (cm):  1.5 Hemostasis achieved with:  ferric subsulfate Outcome: patient tolerated procedure well with no complications   Post-procedure details: wound care instructions given   Additional details:  Wound innoculated with 5 fluorouracil solution.

## 2019-09-07 DIAGNOSIS — R531 Weakness: Secondary | ICD-10-CM | POA: Diagnosis not present

## 2019-09-07 DIAGNOSIS — M25561 Pain in right knee: Secondary | ICD-10-CM | POA: Diagnosis not present

## 2019-09-07 DIAGNOSIS — R269 Unspecified abnormalities of gait and mobility: Secondary | ICD-10-CM | POA: Diagnosis not present

## 2019-09-07 DIAGNOSIS — Z96651 Presence of right artificial knee joint: Secondary | ICD-10-CM | POA: Diagnosis not present

## 2019-09-12 DIAGNOSIS — R531 Weakness: Secondary | ICD-10-CM | POA: Diagnosis not present

## 2019-09-12 DIAGNOSIS — R269 Unspecified abnormalities of gait and mobility: Secondary | ICD-10-CM | POA: Diagnosis not present

## 2019-09-12 DIAGNOSIS — Z96651 Presence of right artificial knee joint: Secondary | ICD-10-CM | POA: Diagnosis not present

## 2019-09-12 DIAGNOSIS — M25561 Pain in right knee: Secondary | ICD-10-CM | POA: Diagnosis not present

## 2019-09-17 DIAGNOSIS — Z96651 Presence of right artificial knee joint: Secondary | ICD-10-CM | POA: Diagnosis not present

## 2019-09-17 DIAGNOSIS — M25561 Pain in right knee: Secondary | ICD-10-CM | POA: Diagnosis not present

## 2019-09-17 DIAGNOSIS — R531 Weakness: Secondary | ICD-10-CM | POA: Diagnosis not present

## 2019-09-17 DIAGNOSIS — R269 Unspecified abnormalities of gait and mobility: Secondary | ICD-10-CM | POA: Diagnosis not present

## 2019-09-18 DIAGNOSIS — Z96651 Presence of right artificial knee joint: Secondary | ICD-10-CM | POA: Diagnosis not present

## 2019-09-18 DIAGNOSIS — M25561 Pain in right knee: Secondary | ICD-10-CM | POA: Diagnosis not present

## 2019-09-18 DIAGNOSIS — R531 Weakness: Secondary | ICD-10-CM | POA: Diagnosis not present

## 2019-09-18 DIAGNOSIS — R269 Unspecified abnormalities of gait and mobility: Secondary | ICD-10-CM | POA: Diagnosis not present

## 2019-09-28 DIAGNOSIS — Z96651 Presence of right artificial knee joint: Secondary | ICD-10-CM | POA: Diagnosis not present

## 2019-09-28 DIAGNOSIS — R531 Weakness: Secondary | ICD-10-CM | POA: Diagnosis not present

## 2019-09-28 DIAGNOSIS — M25561 Pain in right knee: Secondary | ICD-10-CM | POA: Diagnosis not present

## 2019-09-28 DIAGNOSIS — R269 Unspecified abnormalities of gait and mobility: Secondary | ICD-10-CM | POA: Diagnosis not present

## 2019-10-02 DIAGNOSIS — M810 Age-related osteoporosis without current pathological fracture: Secondary | ICD-10-CM | POA: Diagnosis not present

## 2019-10-03 DIAGNOSIS — R269 Unspecified abnormalities of gait and mobility: Secondary | ICD-10-CM | POA: Diagnosis not present

## 2019-10-03 DIAGNOSIS — M25561 Pain in right knee: Secondary | ICD-10-CM | POA: Diagnosis not present

## 2019-10-03 DIAGNOSIS — R531 Weakness: Secondary | ICD-10-CM | POA: Diagnosis not present

## 2019-10-03 DIAGNOSIS — Z96651 Presence of right artificial knee joint: Secondary | ICD-10-CM | POA: Diagnosis not present

## 2019-10-12 DIAGNOSIS — Z96651 Presence of right artificial knee joint: Secondary | ICD-10-CM | POA: Diagnosis not present

## 2019-10-12 DIAGNOSIS — R531 Weakness: Secondary | ICD-10-CM | POA: Diagnosis not present

## 2019-10-12 DIAGNOSIS — R269 Unspecified abnormalities of gait and mobility: Secondary | ICD-10-CM | POA: Diagnosis not present

## 2019-10-12 DIAGNOSIS — M25561 Pain in right knee: Secondary | ICD-10-CM | POA: Diagnosis not present

## 2019-10-15 DIAGNOSIS — M25561 Pain in right knee: Secondary | ICD-10-CM | POA: Diagnosis not present

## 2019-10-15 DIAGNOSIS — R531 Weakness: Secondary | ICD-10-CM | POA: Diagnosis not present

## 2019-10-15 DIAGNOSIS — R269 Unspecified abnormalities of gait and mobility: Secondary | ICD-10-CM | POA: Diagnosis not present

## 2019-10-15 DIAGNOSIS — Z96651 Presence of right artificial knee joint: Secondary | ICD-10-CM | POA: Diagnosis not present

## 2019-10-24 DIAGNOSIS — R269 Unspecified abnormalities of gait and mobility: Secondary | ICD-10-CM | POA: Diagnosis not present

## 2019-10-24 DIAGNOSIS — M25561 Pain in right knee: Secondary | ICD-10-CM | POA: Diagnosis not present

## 2019-10-24 DIAGNOSIS — Z96651 Presence of right artificial knee joint: Secondary | ICD-10-CM | POA: Diagnosis not present

## 2019-10-24 DIAGNOSIS — R531 Weakness: Secondary | ICD-10-CM | POA: Diagnosis not present

## 2019-11-01 DIAGNOSIS — Z96651 Presence of right artificial knee joint: Secondary | ICD-10-CM | POA: Diagnosis not present

## 2019-11-02 DIAGNOSIS — R531 Weakness: Secondary | ICD-10-CM | POA: Diagnosis not present

## 2019-11-02 DIAGNOSIS — Z96651 Presence of right artificial knee joint: Secondary | ICD-10-CM | POA: Diagnosis not present

## 2019-11-02 DIAGNOSIS — R269 Unspecified abnormalities of gait and mobility: Secondary | ICD-10-CM | POA: Diagnosis not present

## 2019-11-02 DIAGNOSIS — M25561 Pain in right knee: Secondary | ICD-10-CM | POA: Diagnosis not present

## 2019-11-07 DIAGNOSIS — E871 Hypo-osmolality and hyponatremia: Secondary | ICD-10-CM | POA: Diagnosis not present

## 2019-11-07 DIAGNOSIS — M174 Other bilateral secondary osteoarthritis of knee: Secondary | ICD-10-CM | POA: Diagnosis not present

## 2019-11-07 DIAGNOSIS — E785 Hyperlipidemia, unspecified: Secondary | ICD-10-CM | POA: Diagnosis not present

## 2019-11-07 DIAGNOSIS — F39 Unspecified mood [affective] disorder: Secondary | ICD-10-CM | POA: Diagnosis not present

## 2019-11-07 DIAGNOSIS — E559 Vitamin D deficiency, unspecified: Secondary | ICD-10-CM | POA: Diagnosis not present

## 2019-11-07 DIAGNOSIS — E0800 Diabetes mellitus due to underlying condition with hyperosmolarity without nonketotic hyperglycemic-hyperosmolar coma (NKHHC): Secondary | ICD-10-CM | POA: Diagnosis not present

## 2019-11-07 DIAGNOSIS — F411 Generalized anxiety disorder: Secondary | ICD-10-CM | POA: Diagnosis not present

## 2019-11-08 ENCOUNTER — Other Ambulatory Visit: Payer: Self-pay | Admitting: Family Medicine

## 2019-11-08 DIAGNOSIS — R748 Abnormal levels of other serum enzymes: Secondary | ICD-10-CM

## 2019-11-09 DIAGNOSIS — R269 Unspecified abnormalities of gait and mobility: Secondary | ICD-10-CM | POA: Diagnosis not present

## 2019-11-09 DIAGNOSIS — R531 Weakness: Secondary | ICD-10-CM | POA: Diagnosis not present

## 2019-11-09 DIAGNOSIS — Z96651 Presence of right artificial knee joint: Secondary | ICD-10-CM | POA: Diagnosis not present

## 2019-11-09 DIAGNOSIS — M25561 Pain in right knee: Secondary | ICD-10-CM | POA: Diagnosis not present

## 2019-11-12 DIAGNOSIS — M25561 Pain in right knee: Secondary | ICD-10-CM | POA: Diagnosis not present

## 2019-11-12 DIAGNOSIS — R269 Unspecified abnormalities of gait and mobility: Secondary | ICD-10-CM | POA: Diagnosis not present

## 2019-11-12 DIAGNOSIS — Z96651 Presence of right artificial knee joint: Secondary | ICD-10-CM | POA: Diagnosis not present

## 2019-11-12 DIAGNOSIS — R531 Weakness: Secondary | ICD-10-CM | POA: Diagnosis not present

## 2019-11-13 DIAGNOSIS — M1990 Unspecified osteoarthritis, unspecified site: Secondary | ICD-10-CM | POA: Diagnosis not present

## 2019-11-13 DIAGNOSIS — R748 Abnormal levels of other serum enzymes: Secondary | ICD-10-CM | POA: Diagnosis not present

## 2019-11-13 DIAGNOSIS — E559 Vitamin D deficiency, unspecified: Secondary | ICD-10-CM | POA: Diagnosis not present

## 2019-11-14 DIAGNOSIS — Z0184 Encounter for antibody response examination: Secondary | ICD-10-CM | POA: Diagnosis not present

## 2019-11-14 DIAGNOSIS — I10 Essential (primary) hypertension: Secondary | ICD-10-CM | POA: Diagnosis not present

## 2019-11-16 ENCOUNTER — Other Ambulatory Visit: Payer: Self-pay | Admitting: Family Medicine

## 2019-11-16 ENCOUNTER — Ambulatory Visit
Admission: RE | Admit: 2019-11-16 | Discharge: 2019-11-16 | Disposition: A | Discharge status: Home / Self Care | Payer: PPO | Source: Ambulatory Visit | Attending: Family Medicine | Admitting: Family Medicine

## 2019-11-16 DIAGNOSIS — R748 Abnormal levels of other serum enzymes: Secondary | ICD-10-CM

## 2019-11-28 DIAGNOSIS — Z96651 Presence of right artificial knee joint: Secondary | ICD-10-CM | POA: Diagnosis not present

## 2019-11-28 DIAGNOSIS — R269 Unspecified abnormalities of gait and mobility: Secondary | ICD-10-CM | POA: Diagnosis not present

## 2019-11-28 DIAGNOSIS — M25561 Pain in right knee: Secondary | ICD-10-CM | POA: Diagnosis not present

## 2019-11-28 DIAGNOSIS — R531 Weakness: Secondary | ICD-10-CM | POA: Diagnosis not present

## 2019-12-03 DIAGNOSIS — M25561 Pain in right knee: Secondary | ICD-10-CM | POA: Diagnosis not present

## 2019-12-03 DIAGNOSIS — R531 Weakness: Secondary | ICD-10-CM | POA: Diagnosis not present

## 2019-12-03 DIAGNOSIS — Z96651 Presence of right artificial knee joint: Secondary | ICD-10-CM | POA: Diagnosis not present

## 2019-12-03 DIAGNOSIS — R269 Unspecified abnormalities of gait and mobility: Secondary | ICD-10-CM | POA: Diagnosis not present

## 2019-12-06 ENCOUNTER — Ambulatory Visit: Payer: PPO | Admitting: Physician Assistant

## 2019-12-10 ENCOUNTER — Ambulatory Visit: Payer: PPO | Admitting: Physician Assistant

## 2019-12-12 DIAGNOSIS — Z96651 Presence of right artificial knee joint: Secondary | ICD-10-CM | POA: Diagnosis not present

## 2019-12-12 DIAGNOSIS — M25561 Pain in right knee: Secondary | ICD-10-CM | POA: Diagnosis not present

## 2019-12-12 DIAGNOSIS — R531 Weakness: Secondary | ICD-10-CM | POA: Diagnosis not present

## 2019-12-12 DIAGNOSIS — R269 Unspecified abnormalities of gait and mobility: Secondary | ICD-10-CM | POA: Diagnosis not present

## 2019-12-14 DIAGNOSIS — H9313 Tinnitus, bilateral: Secondary | ICD-10-CM | POA: Diagnosis not present

## 2019-12-14 DIAGNOSIS — H903 Sensorineural hearing loss, bilateral: Secondary | ICD-10-CM | POA: Diagnosis not present

## 2019-12-18 DIAGNOSIS — Z96651 Presence of right artificial knee joint: Secondary | ICD-10-CM | POA: Diagnosis not present

## 2019-12-18 DIAGNOSIS — R531 Weakness: Secondary | ICD-10-CM | POA: Diagnosis not present

## 2019-12-18 DIAGNOSIS — M25561 Pain in right knee: Secondary | ICD-10-CM | POA: Diagnosis not present

## 2019-12-18 DIAGNOSIS — R269 Unspecified abnormalities of gait and mobility: Secondary | ICD-10-CM | POA: Diagnosis not present

## 2019-12-26 ENCOUNTER — Ambulatory Visit: Payer: PPO | Admitting: Physician Assistant

## 2019-12-26 ENCOUNTER — Other Ambulatory Visit: Payer: Self-pay

## 2019-12-26 ENCOUNTER — Encounter: Payer: Self-pay | Admitting: Physician Assistant

## 2019-12-26 DIAGNOSIS — D485 Neoplasm of uncertain behavior of skin: Secondary | ICD-10-CM | POA: Diagnosis not present

## 2019-12-26 DIAGNOSIS — D0471 Carcinoma in situ of skin of right lower limb, including hip: Secondary | ICD-10-CM | POA: Diagnosis not present

## 2019-12-26 DIAGNOSIS — L570 Actinic keratosis: Secondary | ICD-10-CM

## 2019-12-26 DIAGNOSIS — Z86007 Personal history of in-situ neoplasm of skin: Secondary | ICD-10-CM | POA: Diagnosis not present

## 2019-12-26 NOTE — Progress Notes (Signed)
   Follow up Visit  Subjective  Courtney Shaffer is a 74 y.o. female who presents for the following: Follow-up (3 month follow up on scc left lower leg. Spot is doing well. ). She has a scaling area on her right shin.   Objective  Well appearing patient in no apparent distress; mood and affect are within normal limits.  A focused examination was performed including lower legs. Relevant physical exam findings are noted in the Assessment and Plan.   Objective  Right Lower Leg - Anterior: Erythematous patches with gritty scale.  Objective  Right Lower Leg - Anterior: Brown macule with dark irregular color     Objective  Left Lower Leg - Anterior: Dyspigmented scar.   Assessment & Plan  AK (actinic keratosis) Right Lower Leg - Anterior  Destruction of lesion - Right Lower Leg - Anterior Complexity: simple   Destruction method: cryotherapy   Informed consent: discussed and consent obtained   Timeout:  patient name, date of birth, surgical site, and procedure verified Lesion destroyed using liquid nitrogen: Yes   Outcome: patient tolerated procedure well with no complications    Neoplasm of uncertain behavior of skin Right Lower Leg - Anterior  Skin / nail biopsy Type of biopsy: tangential   Informed consent: discussed and consent obtained   Timeout: patient name, date of birth, surgical site, and procedure verified   Procedure prep:  Patient was prepped and draped in usual sterile fashion (Non sterile) Prep type:  Chlorhexidine Anesthesia: the lesion was anesthetized in a standard fashion   Anesthetic:  1% lidocaine w/ epinephrine 1-100,000 local infiltration Instrument used: flexible razor blade   Outcome: patient tolerated procedure well   Post-procedure details: wound care instructions given    Specimen 1 - Surgical pathology Differential Diagnosis: ATYPIA Check Margins: No  History of squamous cell carcinoma in situ (SCCIS) Left Lower Leg - Anterior

## 2019-12-26 NOTE — Patient Instructions (Signed)

## 2019-12-28 DIAGNOSIS — M1990 Unspecified osteoarthritis, unspecified site: Secondary | ICD-10-CM | POA: Diagnosis not present

## 2019-12-28 DIAGNOSIS — E785 Hyperlipidemia, unspecified: Secondary | ICD-10-CM | POA: Diagnosis not present

## 2019-12-28 DIAGNOSIS — R269 Unspecified abnormalities of gait and mobility: Secondary | ICD-10-CM | POA: Diagnosis not present

## 2019-12-28 DIAGNOSIS — Z96651 Presence of right artificial knee joint: Secondary | ICD-10-CM | POA: Diagnosis not present

## 2019-12-28 DIAGNOSIS — R531 Weakness: Secondary | ICD-10-CM | POA: Diagnosis not present

## 2019-12-28 DIAGNOSIS — R748 Abnormal levels of other serum enzymes: Secondary | ICD-10-CM | POA: Diagnosis not present

## 2019-12-28 DIAGNOSIS — M25561 Pain in right knee: Secondary | ICD-10-CM | POA: Diagnosis not present

## 2020-01-02 ENCOUNTER — Encounter: Payer: Self-pay | Admitting: *Deleted

## 2020-01-02 ENCOUNTER — Telehealth: Payer: Self-pay | Admitting: *Deleted

## 2020-01-02 DIAGNOSIS — Z96651 Presence of right artificial knee joint: Secondary | ICD-10-CM | POA: Diagnosis not present

## 2020-01-02 DIAGNOSIS — R269 Unspecified abnormalities of gait and mobility: Secondary | ICD-10-CM | POA: Diagnosis not present

## 2020-01-02 DIAGNOSIS — R531 Weakness: Secondary | ICD-10-CM | POA: Diagnosis not present

## 2020-01-02 DIAGNOSIS — M25561 Pain in right knee: Secondary | ICD-10-CM | POA: Diagnosis not present

## 2020-01-02 NOTE — Telephone Encounter (Signed)
-----   Message from Arlyss Gandy, [place] sent at 12/31/2019  5:19 PM EDT ----- Needs 30 min surgery

## 2020-01-02 NOTE — Telephone Encounter (Signed)
Pathology to patient, surgery scheduled  

## 2020-01-02 NOTE — Telephone Encounter (Signed)
Left message for patient to return our call.

## 2020-01-07 DIAGNOSIS — Z96651 Presence of right artificial knee joint: Secondary | ICD-10-CM | POA: Diagnosis not present

## 2020-01-07 DIAGNOSIS — M25561 Pain in right knee: Secondary | ICD-10-CM | POA: Diagnosis not present

## 2020-01-07 DIAGNOSIS — R531 Weakness: Secondary | ICD-10-CM | POA: Diagnosis not present

## 2020-01-07 DIAGNOSIS — R269 Unspecified abnormalities of gait and mobility: Secondary | ICD-10-CM | POA: Diagnosis not present

## 2020-01-14 DIAGNOSIS — Z96651 Presence of right artificial knee joint: Secondary | ICD-10-CM | POA: Diagnosis not present

## 2020-01-14 DIAGNOSIS — R531 Weakness: Secondary | ICD-10-CM | POA: Diagnosis not present

## 2020-01-14 DIAGNOSIS — M25561 Pain in right knee: Secondary | ICD-10-CM | POA: Diagnosis not present

## 2020-01-14 DIAGNOSIS — R269 Unspecified abnormalities of gait and mobility: Secondary | ICD-10-CM | POA: Diagnosis not present

## 2020-01-16 DIAGNOSIS — Z96651 Presence of right artificial knee joint: Secondary | ICD-10-CM | POA: Diagnosis not present

## 2020-01-16 DIAGNOSIS — R269 Unspecified abnormalities of gait and mobility: Secondary | ICD-10-CM | POA: Diagnosis not present

## 2020-01-16 DIAGNOSIS — R531 Weakness: Secondary | ICD-10-CM | POA: Diagnosis not present

## 2020-01-16 DIAGNOSIS — M25561 Pain in right knee: Secondary | ICD-10-CM | POA: Diagnosis not present

## 2020-01-16 DIAGNOSIS — Z961 Presence of intraocular lens: Secondary | ICD-10-CM | POA: Diagnosis not present

## 2020-01-16 DIAGNOSIS — H26493 Other secondary cataract, bilateral: Secondary | ICD-10-CM | POA: Diagnosis not present

## 2020-01-16 DIAGNOSIS — H04123 Dry eye syndrome of bilateral lacrimal glands: Secondary | ICD-10-CM | POA: Diagnosis not present

## 2020-01-16 DIAGNOSIS — H52203 Unspecified astigmatism, bilateral: Secondary | ICD-10-CM | POA: Diagnosis not present

## 2020-01-25 DIAGNOSIS — Z96651 Presence of right artificial knee joint: Secondary | ICD-10-CM | POA: Diagnosis not present

## 2020-01-25 DIAGNOSIS — R531 Weakness: Secondary | ICD-10-CM | POA: Diagnosis not present

## 2020-01-25 DIAGNOSIS — M25561 Pain in right knee: Secondary | ICD-10-CM | POA: Diagnosis not present

## 2020-01-25 DIAGNOSIS — R269 Unspecified abnormalities of gait and mobility: Secondary | ICD-10-CM | POA: Diagnosis not present

## 2020-01-30 DIAGNOSIS — Z96651 Presence of right artificial knee joint: Secondary | ICD-10-CM | POA: Diagnosis not present

## 2020-01-30 DIAGNOSIS — R269 Unspecified abnormalities of gait and mobility: Secondary | ICD-10-CM | POA: Diagnosis not present

## 2020-01-30 DIAGNOSIS — M25561 Pain in right knee: Secondary | ICD-10-CM | POA: Diagnosis not present

## 2020-01-30 DIAGNOSIS — R531 Weakness: Secondary | ICD-10-CM | POA: Diagnosis not present

## 2020-01-31 DIAGNOSIS — R269 Unspecified abnormalities of gait and mobility: Secondary | ICD-10-CM | POA: Diagnosis not present

## 2020-01-31 DIAGNOSIS — M25561 Pain in right knee: Secondary | ICD-10-CM | POA: Diagnosis not present

## 2020-01-31 DIAGNOSIS — R531 Weakness: Secondary | ICD-10-CM | POA: Diagnosis not present

## 2020-01-31 DIAGNOSIS — Z96651 Presence of right artificial knee joint: Secondary | ICD-10-CM | POA: Diagnosis not present

## 2020-02-07 ENCOUNTER — Other Ambulatory Visit: Payer: Self-pay

## 2020-02-07 ENCOUNTER — Ambulatory Visit (INDEPENDENT_AMBULATORY_CARE_PROVIDER_SITE_OTHER): Payer: PPO | Admitting: Dermatology

## 2020-02-07 DIAGNOSIS — D0471 Carcinoma in situ of skin of right lower limb, including hip: Secondary | ICD-10-CM | POA: Diagnosis not present

## 2020-02-07 DIAGNOSIS — D049 Carcinoma in situ of skin, unspecified: Secondary | ICD-10-CM

## 2020-02-07 MED ORDER — MUPIROCIN 2 % EX OINT: 1.0000 "application " | TOPICAL_OINTMENT | Freq: Two times a day (BID) | CUTANEOUS | 2 refills | Status: DC

## 2020-02-07 NOTE — Patient Instructions (Signed)

## 2020-02-15 DIAGNOSIS — R269 Unspecified abnormalities of gait and mobility: Secondary | ICD-10-CM | POA: Diagnosis not present

## 2020-02-15 DIAGNOSIS — M25561 Pain in right knee: Secondary | ICD-10-CM | POA: Diagnosis not present

## 2020-02-15 DIAGNOSIS — R531 Weakness: Secondary | ICD-10-CM | POA: Diagnosis not present

## 2020-02-15 DIAGNOSIS — Z96651 Presence of right artificial knee joint: Secondary | ICD-10-CM | POA: Diagnosis not present

## 2020-02-22 DIAGNOSIS — Z96651 Presence of right artificial knee joint: Secondary | ICD-10-CM | POA: Diagnosis not present

## 2020-02-22 DIAGNOSIS — R269 Unspecified abnormalities of gait and mobility: Secondary | ICD-10-CM | POA: Diagnosis not present

## 2020-02-22 DIAGNOSIS — M25561 Pain in right knee: Secondary | ICD-10-CM | POA: Diagnosis not present

## 2020-02-22 DIAGNOSIS — R531 Weakness: Secondary | ICD-10-CM | POA: Diagnosis not present

## 2020-02-27 DIAGNOSIS — R531 Weakness: Secondary | ICD-10-CM | POA: Diagnosis not present

## 2020-02-27 DIAGNOSIS — M25561 Pain in right knee: Secondary | ICD-10-CM | POA: Diagnosis not present

## 2020-02-27 DIAGNOSIS — R269 Unspecified abnormalities of gait and mobility: Secondary | ICD-10-CM | POA: Diagnosis not present

## 2020-02-27 DIAGNOSIS — Z96651 Presence of right artificial knee joint: Secondary | ICD-10-CM | POA: Diagnosis not present

## 2020-02-28 DIAGNOSIS — M25561 Pain in right knee: Secondary | ICD-10-CM | POA: Diagnosis not present

## 2020-02-28 DIAGNOSIS — Z96651 Presence of right artificial knee joint: Secondary | ICD-10-CM | POA: Diagnosis not present

## 2020-02-28 DIAGNOSIS — R269 Unspecified abnormalities of gait and mobility: Secondary | ICD-10-CM | POA: Diagnosis not present

## 2020-02-28 DIAGNOSIS — R531 Weakness: Secondary | ICD-10-CM | POA: Diagnosis not present

## 2020-03-10 DIAGNOSIS — R269 Unspecified abnormalities of gait and mobility: Secondary | ICD-10-CM | POA: Diagnosis not present

## 2020-03-10 DIAGNOSIS — Z96651 Presence of right artificial knee joint: Secondary | ICD-10-CM | POA: Diagnosis not present

## 2020-03-10 DIAGNOSIS — R531 Weakness: Secondary | ICD-10-CM | POA: Diagnosis not present

## 2020-03-10 DIAGNOSIS — M25561 Pain in right knee: Secondary | ICD-10-CM | POA: Diagnosis not present

## 2020-03-18 DIAGNOSIS — Z96651 Presence of right artificial knee joint: Secondary | ICD-10-CM | POA: Diagnosis not present

## 2020-03-18 DIAGNOSIS — Z96653 Presence of artificial knee joint, bilateral: Secondary | ICD-10-CM | POA: Diagnosis not present

## 2020-03-18 DIAGNOSIS — Z96652 Presence of left artificial knee joint: Secondary | ICD-10-CM | POA: Diagnosis not present

## 2020-03-19 DIAGNOSIS — Z96651 Presence of right artificial knee joint: Secondary | ICD-10-CM | POA: Diagnosis not present

## 2020-03-19 DIAGNOSIS — R269 Unspecified abnormalities of gait and mobility: Secondary | ICD-10-CM | POA: Diagnosis not present

## 2020-03-19 DIAGNOSIS — R531 Weakness: Secondary | ICD-10-CM | POA: Diagnosis not present

## 2020-03-19 DIAGNOSIS — M25561 Pain in right knee: Secondary | ICD-10-CM | POA: Diagnosis not present

## 2020-03-26 DIAGNOSIS — Z96651 Presence of right artificial knee joint: Secondary | ICD-10-CM | POA: Diagnosis not present

## 2020-03-26 DIAGNOSIS — M25561 Pain in right knee: Secondary | ICD-10-CM | POA: Diagnosis not present

## 2020-03-26 DIAGNOSIS — R269 Unspecified abnormalities of gait and mobility: Secondary | ICD-10-CM | POA: Diagnosis not present

## 2020-03-26 DIAGNOSIS — R531 Weakness: Secondary | ICD-10-CM | POA: Diagnosis not present

## 2020-04-02 DIAGNOSIS — Z96651 Presence of right artificial knee joint: Secondary | ICD-10-CM | POA: Diagnosis not present

## 2020-04-02 DIAGNOSIS — R269 Unspecified abnormalities of gait and mobility: Secondary | ICD-10-CM | POA: Diagnosis not present

## 2020-04-02 DIAGNOSIS — R531 Weakness: Secondary | ICD-10-CM | POA: Diagnosis not present

## 2020-04-02 DIAGNOSIS — M25561 Pain in right knee: Secondary | ICD-10-CM | POA: Diagnosis not present

## 2020-04-23 DIAGNOSIS — M25561 Pain in right knee: Secondary | ICD-10-CM | POA: Diagnosis not present

## 2020-04-23 DIAGNOSIS — Z96651 Presence of right artificial knee joint: Secondary | ICD-10-CM | POA: Diagnosis not present

## 2020-04-23 DIAGNOSIS — R531 Weakness: Secondary | ICD-10-CM | POA: Diagnosis not present

## 2020-04-23 DIAGNOSIS — R269 Unspecified abnormalities of gait and mobility: Secondary | ICD-10-CM | POA: Diagnosis not present

## 2020-05-07 DIAGNOSIS — M25561 Pain in right knee: Secondary | ICD-10-CM | POA: Diagnosis not present

## 2020-05-07 DIAGNOSIS — Z96651 Presence of right artificial knee joint: Secondary | ICD-10-CM | POA: Diagnosis not present

## 2020-05-07 DIAGNOSIS — R269 Unspecified abnormalities of gait and mobility: Secondary | ICD-10-CM | POA: Diagnosis not present

## 2020-05-07 DIAGNOSIS — R531 Weakness: Secondary | ICD-10-CM | POA: Diagnosis not present

## 2020-05-26 DIAGNOSIS — M25561 Pain in right knee: Secondary | ICD-10-CM | POA: Diagnosis not present

## 2020-05-26 DIAGNOSIS — Z96651 Presence of right artificial knee joint: Secondary | ICD-10-CM | POA: Diagnosis not present

## 2020-05-26 DIAGNOSIS — R269 Unspecified abnormalities of gait and mobility: Secondary | ICD-10-CM | POA: Diagnosis not present

## 2020-05-26 DIAGNOSIS — R531 Weakness: Secondary | ICD-10-CM | POA: Diagnosis not present

## 2020-06-02 DIAGNOSIS — R269 Unspecified abnormalities of gait and mobility: Secondary | ICD-10-CM | POA: Diagnosis not present

## 2020-06-02 DIAGNOSIS — R531 Weakness: Secondary | ICD-10-CM | POA: Diagnosis not present

## 2020-06-02 DIAGNOSIS — M25561 Pain in right knee: Secondary | ICD-10-CM | POA: Diagnosis not present

## 2020-06-02 DIAGNOSIS — Z96651 Presence of right artificial knee joint: Secondary | ICD-10-CM | POA: Diagnosis not present

## 2020-06-11 ENCOUNTER — Ambulatory Visit: Payer: PPO | Admitting: Dermatology

## 2020-06-20 DIAGNOSIS — R269 Unspecified abnormalities of gait and mobility: Secondary | ICD-10-CM | POA: Diagnosis not present

## 2020-06-20 DIAGNOSIS — Z96651 Presence of right artificial knee joint: Secondary | ICD-10-CM | POA: Diagnosis not present

## 2020-06-20 DIAGNOSIS — M25561 Pain in right knee: Secondary | ICD-10-CM | POA: Diagnosis not present

## 2020-06-20 DIAGNOSIS — R531 Weakness: Secondary | ICD-10-CM | POA: Diagnosis not present

## 2020-06-24 DIAGNOSIS — M1990 Unspecified osteoarthritis, unspecified site: Secondary | ICD-10-CM | POA: Diagnosis not present

## 2020-06-24 DIAGNOSIS — R7301 Impaired fasting glucose: Secondary | ICD-10-CM | POA: Diagnosis not present

## 2020-06-24 DIAGNOSIS — R748 Abnormal levels of other serum enzymes: Secondary | ICD-10-CM | POA: Diagnosis not present

## 2020-06-24 DIAGNOSIS — Z0001 Encounter for general adult medical examination with abnormal findings: Secondary | ICD-10-CM | POA: Diagnosis not present

## 2020-06-24 DIAGNOSIS — E559 Vitamin D deficiency, unspecified: Secondary | ICD-10-CM | POA: Diagnosis not present

## 2020-06-24 DIAGNOSIS — E871 Hypo-osmolality and hyponatremia: Secondary | ICD-10-CM | POA: Diagnosis not present

## 2020-06-24 DIAGNOSIS — F39 Unspecified mood [affective] disorder: Secondary | ICD-10-CM | POA: Diagnosis not present

## 2020-06-24 DIAGNOSIS — E785 Hyperlipidemia, unspecified: Secondary | ICD-10-CM | POA: Diagnosis not present

## 2020-06-24 DIAGNOSIS — Z0000 Encounter for general adult medical examination without abnormal findings: Secondary | ICD-10-CM | POA: Diagnosis not present

## 2020-06-24 DIAGNOSIS — M179 Osteoarthritis of knee, unspecified: Secondary | ICD-10-CM | POA: Diagnosis not present

## 2020-07-08 ENCOUNTER — Ambulatory Visit: Payer: PPO | Admitting: Dermatology

## 2020-07-08 ENCOUNTER — Other Ambulatory Visit: Payer: Self-pay

## 2020-07-08 ENCOUNTER — Encounter: Payer: Self-pay | Admitting: Dermatology

## 2020-07-08 DIAGNOSIS — L821 Other seborrheic keratosis: Secondary | ICD-10-CM

## 2020-07-08 DIAGNOSIS — Z85828 Personal history of other malignant neoplasm of skin: Secondary | ICD-10-CM

## 2020-07-08 DIAGNOSIS — B079 Viral wart, unspecified: Secondary | ICD-10-CM | POA: Diagnosis not present

## 2020-07-19 ENCOUNTER — Encounter: Payer: Self-pay | Admitting: Dermatology

## 2020-07-19 NOTE — Progress Notes (Signed)
   Follow-Up Visit   Subjective  Courtney Shaffer is a 75 y.o. female who presents for the following: Follow-up (Patient here today for follow up on right lower leg that was treated last October. Per patient healed well. Patient states she has a spot on her right hand x a couple of months, per patient no bleeding no pain. Also check spot on right cheek x 6 months and mole in front of her right ear x years.).  Recheck site skin cancer right leg, new spot right wrist area. Location:  Duration:  Quality:  Associated Signs/Symptoms: Modifying Factors:  Severity:  Timing: Context:   Objective  Well appearing patient in no apparent distress; mood and affect are within normal limits. Objective  Right Wrist - Posterior: Pink verrucous 5 mm papule  Objective  Right Parotid Area: 5 mm light brown textured flattopped papule  Objective  Right Lower Leg - Anterior: No sign residual, minimal scar, moderate postinflammatory dyschromia.    All sun exposed areas plus back examined.   Assessment & Plan    Viral warts, unspecified type Right Wrist - Posterior  I explained to the patient that there is no perfect therapy for viral warts.  The spot does get inflamed and she would like to try freezing.  Destruction of lesion - Right Wrist - Posterior Complexity: simple   Destruction method: cryotherapy   Informed consent: discussed and consent obtained   Timeout:  patient name, date of birth, surgical site, and procedure verified Lesion destroyed using liquid nitrogen: Yes   Cryotherapy cycles:  3 Outcome: patient tolerated procedure well with no complications    Seborrheic keratosis Right Parotid Area  Leave if stable  Personal history of skin cancer Right Lower Leg - Anterior  Recheck as needed change      I, Lavonna Monarch, MD, have reviewed all documentation for this visit.  The documentation on 07/19/20 for the exam, diagnosis, procedures, and orders are all accurate and  complete.

## 2020-07-21 DIAGNOSIS — M25561 Pain in right knee: Secondary | ICD-10-CM | POA: Diagnosis not present

## 2020-07-21 DIAGNOSIS — R269 Unspecified abnormalities of gait and mobility: Secondary | ICD-10-CM | POA: Diagnosis not present

## 2020-07-21 DIAGNOSIS — R531 Weakness: Secondary | ICD-10-CM | POA: Diagnosis not present

## 2020-07-21 DIAGNOSIS — Z96651 Presence of right artificial knee joint: Secondary | ICD-10-CM | POA: Diagnosis not present

## 2020-07-30 DIAGNOSIS — R269 Unspecified abnormalities of gait and mobility: Secondary | ICD-10-CM | POA: Diagnosis not present

## 2020-07-30 DIAGNOSIS — M25561 Pain in right knee: Secondary | ICD-10-CM | POA: Diagnosis not present

## 2020-07-30 DIAGNOSIS — Z96651 Presence of right artificial knee joint: Secondary | ICD-10-CM | POA: Diagnosis not present

## 2020-07-30 DIAGNOSIS — R531 Weakness: Secondary | ICD-10-CM | POA: Diagnosis not present

## 2020-08-27 DIAGNOSIS — Z96651 Presence of right artificial knee joint: Secondary | ICD-10-CM | POA: Diagnosis not present

## 2020-08-27 DIAGNOSIS — R531 Weakness: Secondary | ICD-10-CM | POA: Diagnosis not present

## 2020-08-27 DIAGNOSIS — M25561 Pain in right knee: Secondary | ICD-10-CM | POA: Diagnosis not present

## 2020-08-27 DIAGNOSIS — R269 Unspecified abnormalities of gait and mobility: Secondary | ICD-10-CM | POA: Diagnosis not present

## 2020-09-10 DIAGNOSIS — M25561 Pain in right knee: Secondary | ICD-10-CM | POA: Diagnosis not present

## 2020-09-10 DIAGNOSIS — R531 Weakness: Secondary | ICD-10-CM | POA: Diagnosis not present

## 2020-09-10 DIAGNOSIS — Z96651 Presence of right artificial knee joint: Secondary | ICD-10-CM | POA: Diagnosis not present

## 2020-09-10 DIAGNOSIS — R269 Unspecified abnormalities of gait and mobility: Secondary | ICD-10-CM | POA: Diagnosis not present

## 2020-09-16 DIAGNOSIS — Z1231 Encounter for screening mammogram for malignant neoplasm of breast: Secondary | ICD-10-CM | POA: Diagnosis not present

## 2020-09-24 DIAGNOSIS — R531 Weakness: Secondary | ICD-10-CM | POA: Diagnosis not present

## 2020-09-24 DIAGNOSIS — R269 Unspecified abnormalities of gait and mobility: Secondary | ICD-10-CM | POA: Diagnosis not present

## 2020-09-24 DIAGNOSIS — Z96651 Presence of right artificial knee joint: Secondary | ICD-10-CM | POA: Diagnosis not present

## 2020-09-24 DIAGNOSIS — M25561 Pain in right knee: Secondary | ICD-10-CM | POA: Diagnosis not present

## 2020-09-30 DIAGNOSIS — E871 Hypo-osmolality and hyponatremia: Secondary | ICD-10-CM | POA: Diagnosis not present

## 2020-09-30 DIAGNOSIS — F39 Unspecified mood [affective] disorder: Secondary | ICD-10-CM | POA: Diagnosis not present

## 2020-09-30 DIAGNOSIS — E785 Hyperlipidemia, unspecified: Secondary | ICD-10-CM | POA: Diagnosis not present

## 2020-09-30 DIAGNOSIS — R748 Abnormal levels of other serum enzymes: Secondary | ICD-10-CM | POA: Diagnosis not present

## 2020-09-30 DIAGNOSIS — Z0001 Encounter for general adult medical examination with abnormal findings: Secondary | ICD-10-CM | POA: Diagnosis not present

## 2020-09-30 DIAGNOSIS — E559 Vitamin D deficiency, unspecified: Secondary | ICD-10-CM | POA: Diagnosis not present

## 2020-10-03 DIAGNOSIS — E785 Hyperlipidemia, unspecified: Secondary | ICD-10-CM | POA: Diagnosis not present

## 2020-10-03 DIAGNOSIS — R748 Abnormal levels of other serum enzymes: Secondary | ICD-10-CM | POA: Diagnosis not present

## 2020-10-03 DIAGNOSIS — R3589 Other polyuria: Secondary | ICD-10-CM | POA: Diagnosis not present

## 2020-10-03 DIAGNOSIS — R5383 Other fatigue: Secondary | ICD-10-CM | POA: Diagnosis not present

## 2020-10-08 DIAGNOSIS — Z96651 Presence of right artificial knee joint: Secondary | ICD-10-CM | POA: Diagnosis not present

## 2020-10-08 DIAGNOSIS — R269 Unspecified abnormalities of gait and mobility: Secondary | ICD-10-CM | POA: Diagnosis not present

## 2020-10-08 DIAGNOSIS — R531 Weakness: Secondary | ICD-10-CM | POA: Diagnosis not present

## 2020-10-08 DIAGNOSIS — M25561 Pain in right knee: Secondary | ICD-10-CM | POA: Diagnosis not present

## 2020-10-15 DIAGNOSIS — H26493 Other secondary cataract, bilateral: Secondary | ICD-10-CM | POA: Diagnosis not present

## 2020-10-15 DIAGNOSIS — Z961 Presence of intraocular lens: Secondary | ICD-10-CM | POA: Diagnosis not present

## 2020-10-29 DIAGNOSIS — R269 Unspecified abnormalities of gait and mobility: Secondary | ICD-10-CM | POA: Diagnosis not present

## 2020-10-29 DIAGNOSIS — Z96651 Presence of right artificial knee joint: Secondary | ICD-10-CM | POA: Diagnosis not present

## 2020-10-29 DIAGNOSIS — R531 Weakness: Secondary | ICD-10-CM | POA: Diagnosis not present

## 2020-10-29 DIAGNOSIS — M25561 Pain in right knee: Secondary | ICD-10-CM | POA: Diagnosis not present

## 2020-11-10 ENCOUNTER — Encounter: Payer: Self-pay | Admitting: Dermatology

## 2020-11-10 NOTE — Progress Notes (Signed)
   Follow-Up Visit   Subjective  Courtney Shaffer is a 75 y.o. female who presents for the following: Procedure (scc insitu right lower leg.).  Has right leg Location:  Duration:  Quality:  Associated Signs/Symptoms: Modifying Factors:  Severity:  Timing: Context:   Objective  Well appearing patient in no apparent distress; mood and affect are within normal limits. Right Lower Leg - Anterior Lesion identified by Dr.Gulianna Hornsby.     A focused examination was performed including leg. Relevant physical exam findings are noted in the Assessment and Plan.   Assessment & Plan    Squamous cell carcinoma in situ (SCCIS) of skin Right Lower Leg - Anterior  Destruction of lesion Complexity: simple   Destruction method: electrodesiccation and curettage   Informed consent: discussed and consent obtained   Timeout:  patient name, date of birth, surgical site, and procedure verified Anesthesia: the lesion was anesthetized in a standard fashion   Anesthetic:  1% lidocaine w/ epinephrine 1-100,000 local infiltration Curettage performed in three different directions: Yes   Electrodesiccation performed over the curetted area: Yes   Curettage cycles:  3 Lesion length (cm):  2 Lesion width (cm):  2 Margin per side (cm):  0 Final wound size (cm):  2 Hemostasis achieved with:  ferric subsulfate and electrodesiccation Outcome: patient tolerated procedure well with no complications   Additional details:  Wound innoculated with 5 fluorouracil solution.  mupirocin ointment (BACTROBAN) 2 % Apply 1 application topically 2 (two) times daily.      I, Lavonna Monarch, MD, have reviewed all documentation for this visit.  The documentation on 11/10/20 for the exam, diagnosis, procedures, and orders are all accurate and complete.

## 2020-12-10 DIAGNOSIS — M25561 Pain in right knee: Secondary | ICD-10-CM | POA: Diagnosis not present

## 2020-12-10 DIAGNOSIS — R269 Unspecified abnormalities of gait and mobility: Secondary | ICD-10-CM | POA: Diagnosis not present

## 2020-12-10 DIAGNOSIS — Z96651 Presence of right artificial knee joint: Secondary | ICD-10-CM | POA: Diagnosis not present

## 2020-12-10 DIAGNOSIS — R531 Weakness: Secondary | ICD-10-CM | POA: Diagnosis not present

## 2020-12-16 DIAGNOSIS — M1990 Unspecified osteoarthritis, unspecified site: Secondary | ICD-10-CM | POA: Diagnosis not present

## 2020-12-16 DIAGNOSIS — E871 Hypo-osmolality and hyponatremia: Secondary | ICD-10-CM | POA: Diagnosis not present

## 2020-12-16 DIAGNOSIS — F411 Generalized anxiety disorder: Secondary | ICD-10-CM | POA: Diagnosis not present

## 2020-12-16 DIAGNOSIS — E559 Vitamin D deficiency, unspecified: Secondary | ICD-10-CM | POA: Diagnosis not present

## 2020-12-16 DIAGNOSIS — R748 Abnormal levels of other serum enzymes: Secondary | ICD-10-CM | POA: Diagnosis not present

## 2020-12-16 DIAGNOSIS — Z0001 Encounter for general adult medical examination with abnormal findings: Secondary | ICD-10-CM | POA: Diagnosis not present

## 2020-12-16 DIAGNOSIS — F39 Unspecified mood [affective] disorder: Secondary | ICD-10-CM | POA: Diagnosis not present

## 2020-12-16 DIAGNOSIS — E1165 Type 2 diabetes mellitus with hyperglycemia: Secondary | ICD-10-CM | POA: Diagnosis not present

## 2020-12-16 DIAGNOSIS — M179 Osteoarthritis of knee, unspecified: Secondary | ICD-10-CM | POA: Diagnosis not present

## 2020-12-16 DIAGNOSIS — E785 Hyperlipidemia, unspecified: Secondary | ICD-10-CM | POA: Diagnosis not present

## 2020-12-18 DIAGNOSIS — H26492 Other secondary cataract, left eye: Secondary | ICD-10-CM | POA: Diagnosis not present

## 2020-12-22 DIAGNOSIS — E785 Hyperlipidemia, unspecified: Secondary | ICD-10-CM | POA: Diagnosis not present

## 2020-12-22 DIAGNOSIS — F411 Generalized anxiety disorder: Secondary | ICD-10-CM | POA: Diagnosis not present

## 2020-12-22 DIAGNOSIS — M1990 Unspecified osteoarthritis, unspecified site: Secondary | ICD-10-CM | POA: Diagnosis not present

## 2020-12-22 DIAGNOSIS — Z0001 Encounter for general adult medical examination with abnormal findings: Secondary | ICD-10-CM | POA: Diagnosis not present

## 2020-12-24 DIAGNOSIS — R531 Weakness: Secondary | ICD-10-CM | POA: Diagnosis not present

## 2020-12-24 DIAGNOSIS — R269 Unspecified abnormalities of gait and mobility: Secondary | ICD-10-CM | POA: Diagnosis not present

## 2020-12-24 DIAGNOSIS — M25561 Pain in right knee: Secondary | ICD-10-CM | POA: Diagnosis not present

## 2020-12-24 DIAGNOSIS — Z96651 Presence of right artificial knee joint: Secondary | ICD-10-CM | POA: Diagnosis not present

## 2020-12-31 DIAGNOSIS — R269 Unspecified abnormalities of gait and mobility: Secondary | ICD-10-CM | POA: Diagnosis not present

## 2020-12-31 DIAGNOSIS — Z96651 Presence of right artificial knee joint: Secondary | ICD-10-CM | POA: Diagnosis not present

## 2020-12-31 DIAGNOSIS — M25561 Pain in right knee: Secondary | ICD-10-CM | POA: Diagnosis not present

## 2020-12-31 DIAGNOSIS — R531 Weakness: Secondary | ICD-10-CM | POA: Diagnosis not present

## 2021-01-08 DIAGNOSIS — H26491 Other secondary cataract, right eye: Secondary | ICD-10-CM | POA: Diagnosis not present

## 2021-01-13 ENCOUNTER — Ambulatory Visit: Payer: PPO | Admitting: Dermatology

## 2021-01-16 DIAGNOSIS — R269 Unspecified abnormalities of gait and mobility: Secondary | ICD-10-CM | POA: Diagnosis not present

## 2021-01-16 DIAGNOSIS — R531 Weakness: Secondary | ICD-10-CM | POA: Diagnosis not present

## 2021-01-16 DIAGNOSIS — Z96651 Presence of right artificial knee joint: Secondary | ICD-10-CM | POA: Diagnosis not present

## 2021-01-16 DIAGNOSIS — M25561 Pain in right knee: Secondary | ICD-10-CM | POA: Diagnosis not present

## 2021-01-27 DIAGNOSIS — Z96651 Presence of right artificial knee joint: Secondary | ICD-10-CM | POA: Diagnosis not present

## 2021-01-27 DIAGNOSIS — R269 Unspecified abnormalities of gait and mobility: Secondary | ICD-10-CM | POA: Diagnosis not present

## 2021-01-27 DIAGNOSIS — R531 Weakness: Secondary | ICD-10-CM | POA: Diagnosis not present

## 2021-01-27 DIAGNOSIS — M25561 Pain in right knee: Secondary | ICD-10-CM | POA: Diagnosis not present

## 2021-02-11 ENCOUNTER — Ambulatory Visit: Payer: PPO | Admitting: Dermatology

## 2021-02-19 DIAGNOSIS — Z96651 Presence of right artificial knee joint: Secondary | ICD-10-CM | POA: Diagnosis not present

## 2021-02-19 DIAGNOSIS — Z96652 Presence of left artificial knee joint: Secondary | ICD-10-CM | POA: Diagnosis not present

## 2021-02-19 DIAGNOSIS — Z96653 Presence of artificial knee joint, bilateral: Secondary | ICD-10-CM | POA: Diagnosis not present

## 2021-02-27 DIAGNOSIS — M25561 Pain in right knee: Secondary | ICD-10-CM | POA: Diagnosis not present

## 2021-02-27 DIAGNOSIS — R531 Weakness: Secondary | ICD-10-CM | POA: Diagnosis not present

## 2021-02-27 DIAGNOSIS — Z96651 Presence of right artificial knee joint: Secondary | ICD-10-CM | POA: Diagnosis not present

## 2021-02-27 DIAGNOSIS — R269 Unspecified abnormalities of gait and mobility: Secondary | ICD-10-CM | POA: Diagnosis not present

## 2021-03-09 IMAGING — US US ABDOMEN LIMITED
1 series · 14 of 25 positions shown · non-contrast
Comparison: None.

CLINICAL DATA: Elevated liver enzymes

EXAM:
ULTRASOUND ABDOMEN LIMITED RIGHT UPPER QUADRANT

[Series 1: us abdomen limited · 0.22mm/px · 14 of 40 slices shown]
[im 1/40]
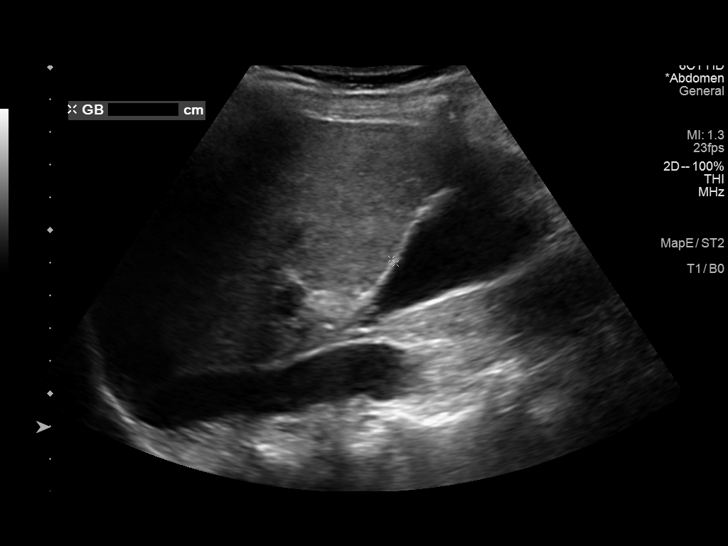
[im 4/40]
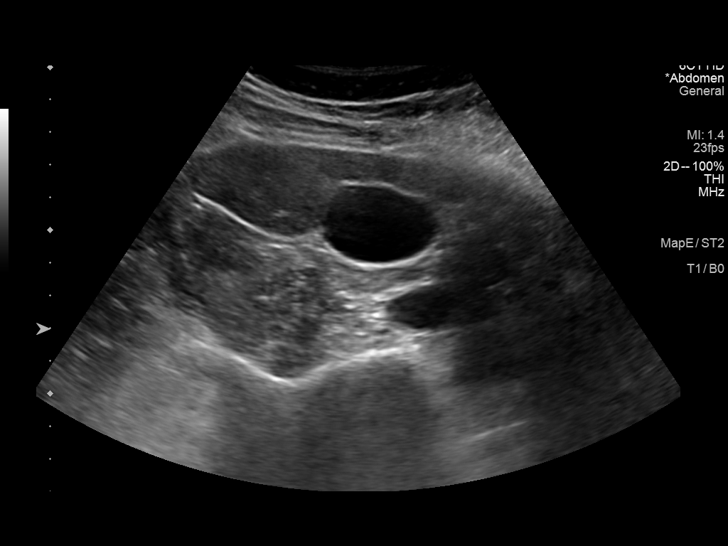
[im 7/40]
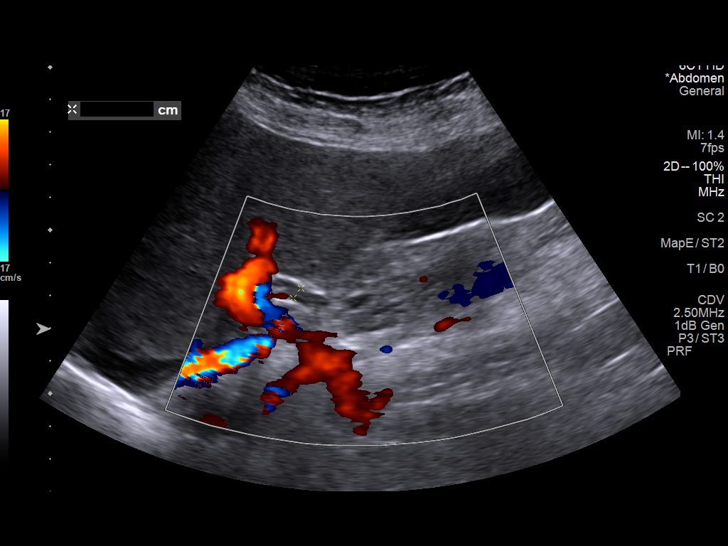
[im 10/40]
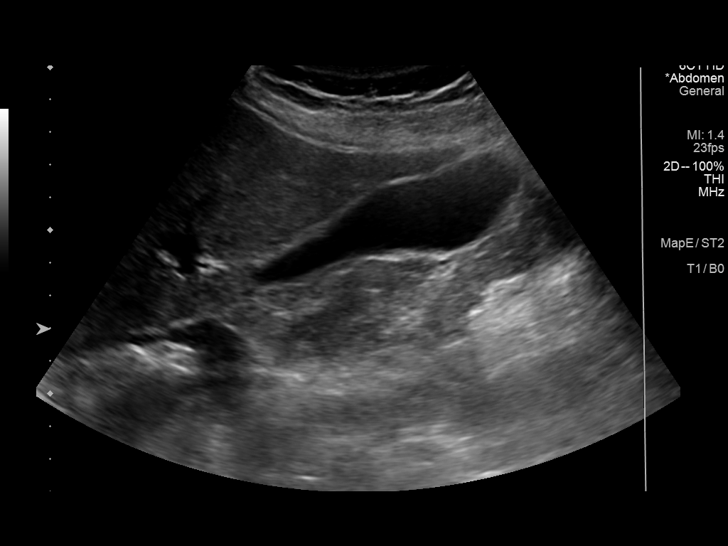
[im 14/40]
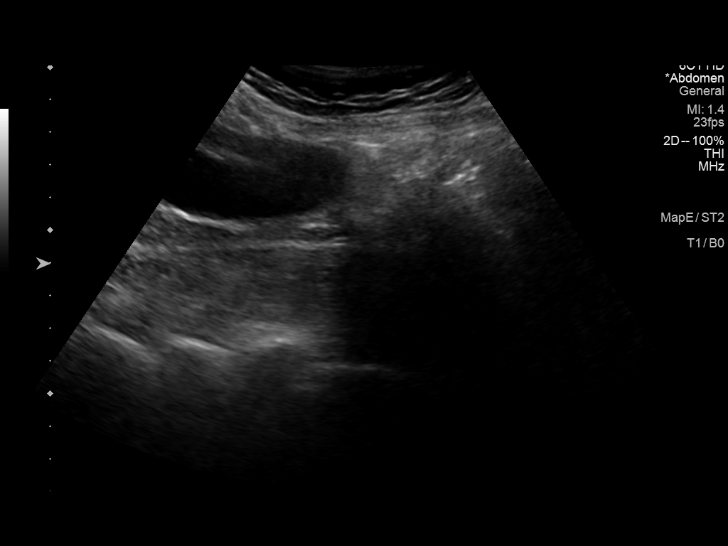
[im 15/40]
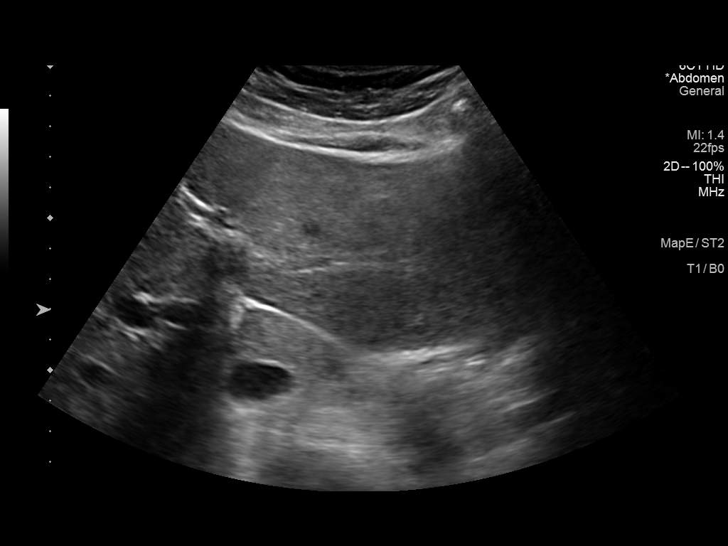
[im 18/40]
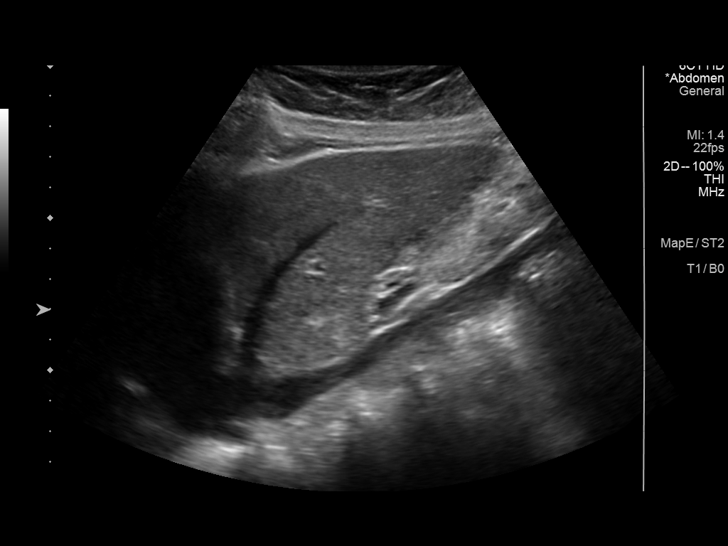
[im 22/40]
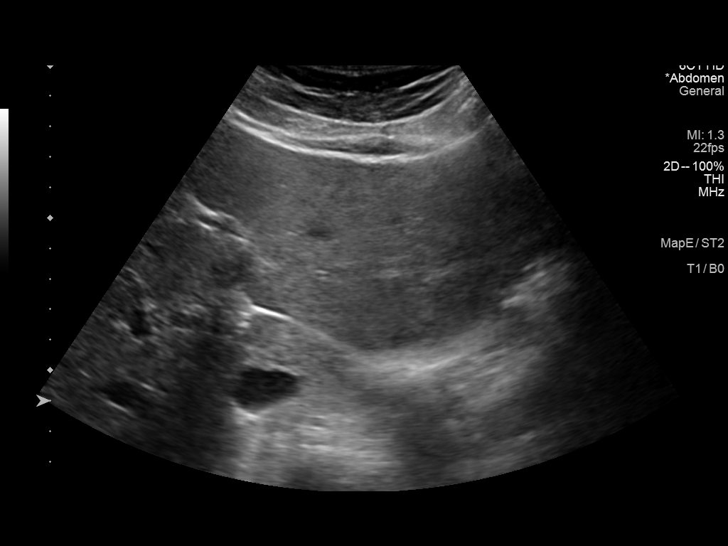
[im 25/40]
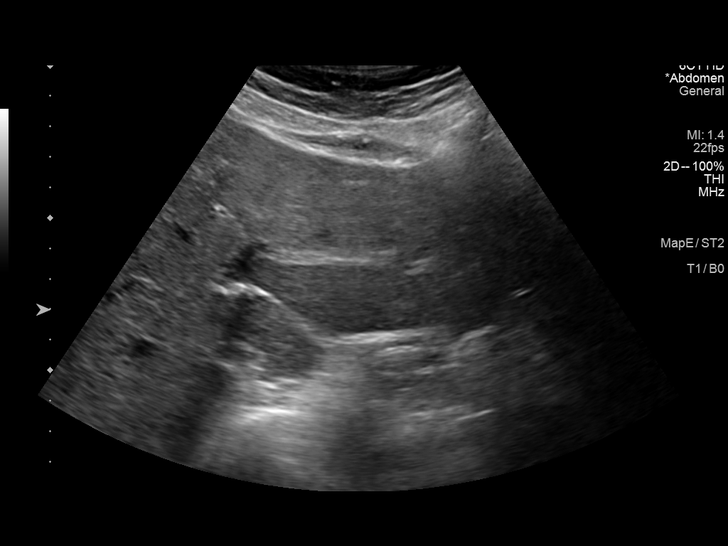
[im 27/40]
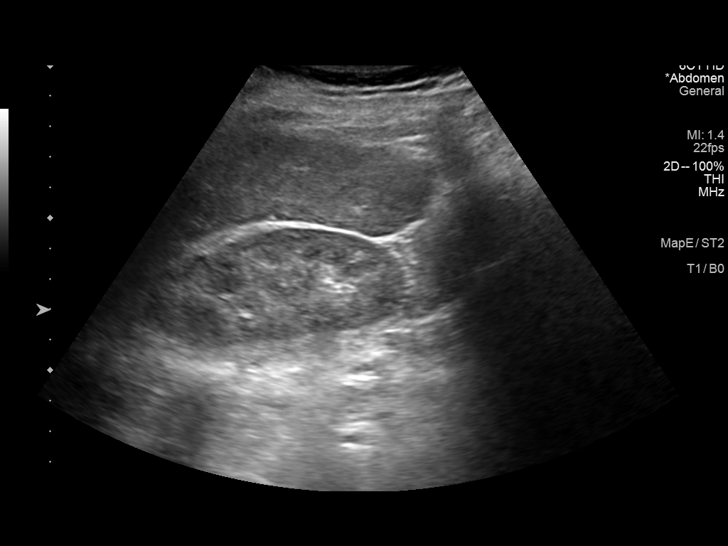
[im 30/40]
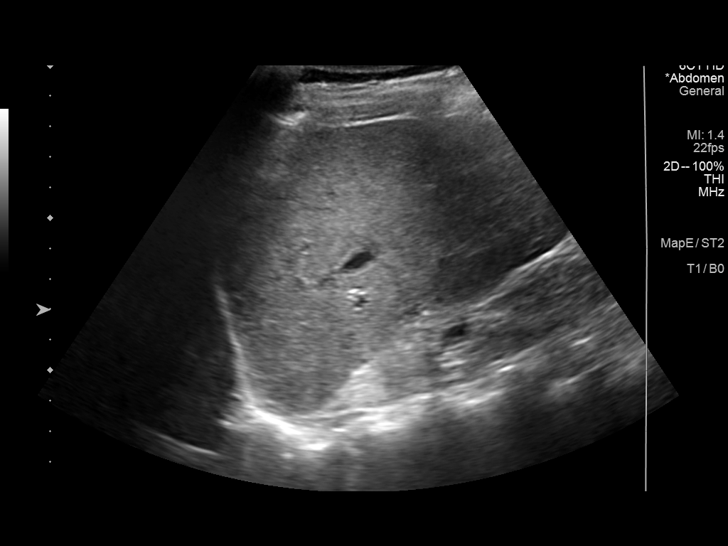
[im 33/40]
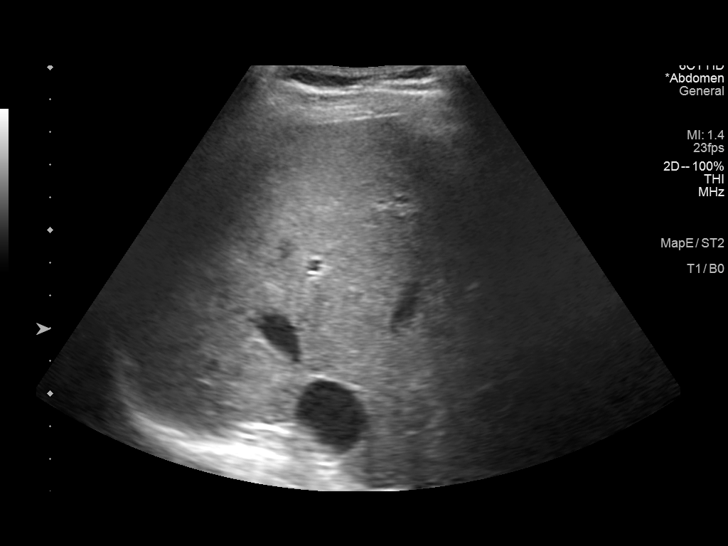
[im 36/40]
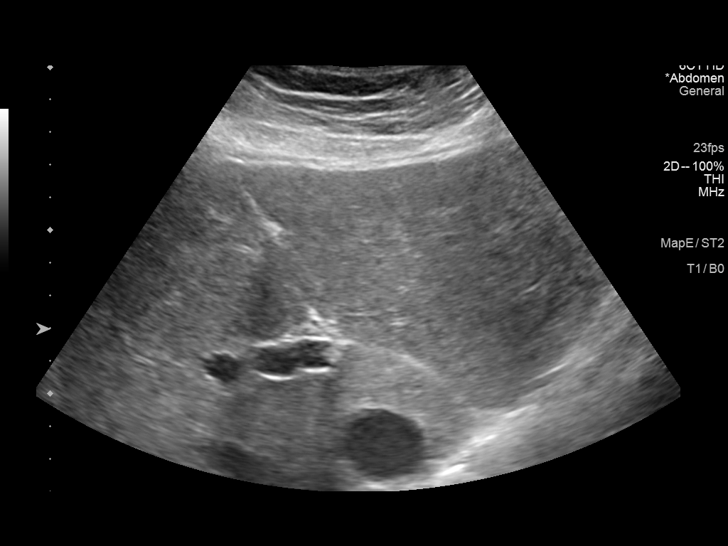
[im 40/40]
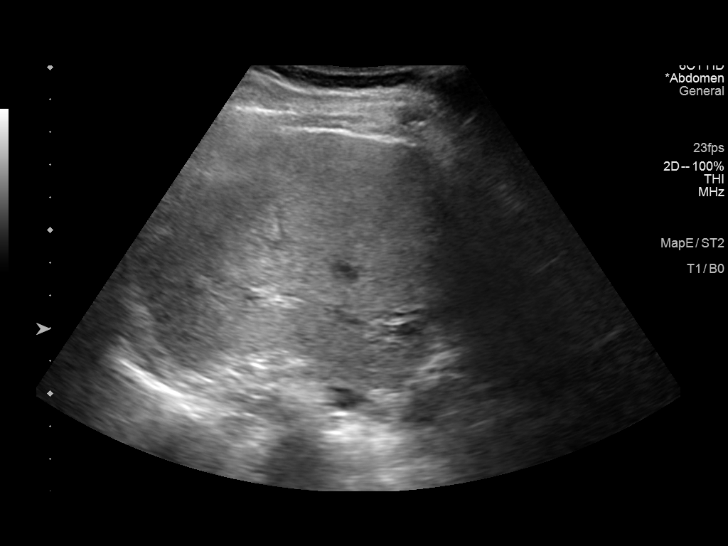

[14 of 25 positions shown; findings below may reference images not displayed]

FINDINGS: Gallbladder:

Normally distended without stones or wall thickening. No
pericholecystic fluid or sonographic Murphy sign.

Common bile duct:

Diameter: 4 mm

Liver:

Normal echogenicity without mass. Margins appear smooth. No
intrahepatic biliary dilatation. Portal vein is patent on color
Doppler imaging with normal direction of blood flow towards the
liver.

Other: No RIGHT upper quadrant free fluid.
IMPRESSION: Normal exam.

## 2021-03-23 DIAGNOSIS — H26492 Other secondary cataract, left eye: Secondary | ICD-10-CM | POA: Diagnosis not present

## 2021-04-02 DIAGNOSIS — H26492 Other secondary cataract, left eye: Secondary | ICD-10-CM | POA: Diagnosis not present

## 2021-05-12 ENCOUNTER — Encounter: Payer: Self-pay | Admitting: Dermatology

## 2021-05-12 ENCOUNTER — Other Ambulatory Visit: Payer: Self-pay

## 2021-05-12 ENCOUNTER — Ambulatory Visit: Payer: PPO | Admitting: Dermatology

## 2021-05-12 DIAGNOSIS — D485 Neoplasm of uncertain behavior of skin: Secondary | ICD-10-CM

## 2021-05-12 DIAGNOSIS — L815 Leukoderma, not elsewhere classified: Secondary | ICD-10-CM

## 2021-05-12 DIAGNOSIS — L570 Actinic keratosis: Secondary | ICD-10-CM

## 2021-05-12 DIAGNOSIS — L820 Inflamed seborrheic keratosis: Secondary | ICD-10-CM

## 2021-05-12 DIAGNOSIS — Z1283 Encounter for screening for malignant neoplasm of skin: Secondary | ICD-10-CM

## 2021-05-12 NOTE — Patient Instructions (Signed)

## 2021-05-31 ENCOUNTER — Encounter: Payer: Self-pay | Admitting: Dermatology

## 2021-05-31 NOTE — Progress Notes (Signed)
° °  Follow-Up Visit   Subjective  Courtney Shaffer is a 76 y.o. female who presents for the following: Annual Exam (Right cheek- has grown some).  Skin examination, spot on right cheek has grown Location:  Duration:  Quality:  Associated Signs/Symptoms: Modifying Factors:  Severity:  Timing: Context:   Objective  Well appearing patient in no apparent distress; mood and affect are within normal limits. Torso - Posterior (Back) Full body skin examination: No atypical pigmented lesions.  1 possible nonmelanoma skin cancer above right medial eyebrow will be biopsied.  Left Breast Hypopigmented to 3 mm macules  Right Forehead 6 mm waxy crust       right sideburn 1.6 cm inflamed brown crust       A full examination was performed including scalp, head, eyes, ears, nose, lips, neck, chest, axillae, abdomen, back, buttocks, bilateral upper extremities, bilateral lower extremities, hands, feet, fingers, toes, fingernails, and toenails. All findings within normal limits unless otherwise noted below.  Areas beneath undergarments not fully examined.   Assessment & Plan    Encounter for screening for malignant neoplasm of skin Torso - Posterior (Back)  Annual skin examination  Guttate hypomelanosis Left Breast  No intervention initiated  Neoplasm of uncertain behavior of skin (2) Right Forehead  Skin / nail biopsy Type of biopsy: tangential   Informed consent: discussed and consent obtained   Timeout: patient name, date of birth, surgical site, and procedure verified   Procedure prep:  Patient was prepped and draped in usual sterile fashion (Non sterile) Prep type:  Chlorhexidine Anesthesia: the lesion was anesthetized in a standard fashion   Anesthetic:  1% lidocaine w/ epinephrine 1-100,000 local infiltration Instrument used: flexible razor blade   Outcome: patient tolerated procedure well   Post-procedure details: wound care instructions given    Specimen 1 -  Surgical pathology Differential Diagnosis: bcc vs scc   Check Margins: No  right sideburn  Skin / nail biopsy Type of biopsy: tangential   Informed consent: discussed and consent obtained   Timeout: patient name, date of birth, surgical site, and procedure verified   Procedure prep:  Patient was prepped and draped in usual sterile fashion (Non sterile) Prep type:  Chlorhexidine Anesthesia: the lesion was anesthetized in a standard fashion   Anesthetic:  1% lidocaine w/ epinephrine 1-100,000 local infiltration Instrument used: flexible razor blade   Outcome: patient tolerated procedure well   Post-procedure details: wound care instructions given    Specimen 2 - Surgical pathology Differential Diagnosis: r/o sk  Check Margins: No      I, Lavonna Monarch, MD, have reviewed all documentation for this visit.  The documentation on 05/31/21 for the exam, diagnosis, procedures, and orders are all accurate and complete.

## 2021-06-02 ENCOUNTER — Telehealth: Payer: Self-pay

## 2021-06-02 NOTE — Telephone Encounter (Signed)
Patient walked in to get her pathology results. Patient aware of results.

## 2021-08-17 ENCOUNTER — Ambulatory Visit: Payer: PPO | Admitting: Dermatology

## 2021-09-09 ENCOUNTER — Ambulatory Visit: Payer: PPO | Admitting: Dermatology

## 2021-09-14 ENCOUNTER — Ambulatory Visit: Payer: PPO | Admitting: Dermatology

## 2021-09-14 ENCOUNTER — Encounter: Payer: Self-pay | Admitting: Dermatology

## 2021-09-14 DIAGNOSIS — L814 Other melanin hyperpigmentation: Secondary | ICD-10-CM | POA: Diagnosis not present

## 2021-09-14 DIAGNOSIS — B070 Plantar wart: Secondary | ICD-10-CM

## 2021-09-14 NOTE — Patient Instructions (Signed)
Instructions for Home Use of Dichloroacetic Acid ([place])   1. Keep your bottle upright in a very safe location with the lid secure.  2. After bathing, apply [place] with a toothpick or a "bald" cotton applicator. Apply a thin film, wait 30 seconds/until dry and cover with waterproof tape/the "sticky" part of the band-aid. Remove the tape/band-aid after 1 to 24 hours (gradually build up duration) and repeat 1-2 times weekly.  3. You may need to periodically remove the thick surface skin. Do this after bathing (before applying [place]) using a pumice stone or emery board or commercial rough skin remover. (REMEMBER to not use any of these on normal skin!!)  4. [place] is meant only for warts on the palms, fingers, and bottoms on feet. NEVER use it on any other areas.  5. This is a strong medicine. There is a risk of severe burn or scar.  6. We will recheck your progress in 1-2 months. Call the office if there are any questions or problems.

## 2021-10-02 ENCOUNTER — Encounter: Payer: Self-pay | Admitting: Dermatology

## 2021-10-19 ENCOUNTER — Ambulatory Visit: Payer: PPO | Admitting: Dermatology

## 2021-10-19 DIAGNOSIS — B070 Plantar wart: Secondary | ICD-10-CM | POA: Diagnosis not present

## 2021-11-07 ENCOUNTER — Encounter: Payer: Self-pay | Admitting: Dermatology

## 2021-11-08 NOTE — Progress Notes (Signed)
   Follow-Up Visit   Subjective  Courtney Shaffer is a 76 y.o. female who presents for the following: Follow-up (Follow up on warts on feet. Patient has used the [facility] [address]. Her feet are peeling. Wants a lesion on cheek removed in the fall. ).  Warts on feet spot on face Location:  Duration:  Quality:  Associated Signs/Symptoms: Modifying Factors:  Severity:  Timing: Context:   Objective  Well appearing patient in no apparent distress; mood and affect are within normal limits. Right Foot - Posterior Verrucous papules -- Discussed viral etiology and contagion.     A focused examination was performed including head, neck, feet. Relevant physical exam findings are noted in the Assessment and Plan.   Assessment & Plan    Plantar wart Right Foot - Posterior  Pair in office patient will continue with the home DCA      I, Lavonna Monarch, MD, have reviewed all documentation for this visit.  The documentation on 11/08/21 for the exam, diagnosis, procedures, and orders are all accurate and complete.

## 2022-03-15 ENCOUNTER — Ambulatory Visit
Admission: RE | Admit: 2022-03-15 | Discharge: 2022-03-15 | Disposition: A | Discharge status: Home / Self Care | Payer: PPO | Source: Ambulatory Visit | Attending: Family Medicine | Admitting: Family Medicine

## 2022-03-15 ENCOUNTER — Other Ambulatory Visit: Payer: Self-pay | Admitting: Family Medicine

## 2022-03-15 DIAGNOSIS — M25521 Pain in right elbow: Secondary | ICD-10-CM

## 2023-10-11 ENCOUNTER — Other Ambulatory Visit: Payer: Self-pay | Admitting: Family Medicine

## 2023-10-11 ENCOUNTER — Ambulatory Visit
Admission: RE | Admit: 2023-10-11 | Discharge: 2023-10-11 | Disposition: A | Discharge status: Home / Self Care | Source: Ambulatory Visit | Attending: Family Medicine | Admitting: Family Medicine

## 2023-10-11 DIAGNOSIS — M25551 Pain in right hip: Secondary | ICD-10-CM

## 2024-03-06 ENCOUNTER — Other Ambulatory Visit: Payer: Self-pay | Admitting: Family Medicine

## 2024-03-06 DIAGNOSIS — M5416 Radiculopathy, lumbar region: Secondary | ICD-10-CM

## 2024-04-10 ENCOUNTER — Ambulatory Visit
Admission: RE | Admit: 2024-04-10 | Discharge: 2024-04-10 | Disposition: A | Discharge status: Home / Self Care | Source: Ambulatory Visit | Attending: Family Medicine | Admitting: Family Medicine

## 2024-04-10 DIAGNOSIS — M5416 Radiculopathy, lumbar region: Secondary | ICD-10-CM

## 2024-04-10 MED ORDER — GADOPICLENOL 0.5 MMOL/ML IV SOLN
6.0000 mL | Freq: Once | INTRAVENOUS | Status: AC | PRN
  Administered 2024-04-10: 6 mL via INTRAVENOUS
